# Patient Record
Sex: Male | Born: 1970
Health system: Southern US, Community
[De-identification: ages and names within clinical notes are randomized; demographics above are authoritative.]

## PROBLEM LIST (undated history)

## (undated) DIAGNOSIS — K5090 Crohn's disease, unspecified, without complications: Secondary | ICD-10-CM

## (undated) DIAGNOSIS — K56609 Unspecified intestinal obstruction, unspecified as to partial versus complete obstruction: Secondary | ICD-10-CM

## (undated) HISTORY — PX: HERNIA REPAIR: SHX51

---

## 2013-01-26 ENCOUNTER — Encounter (HOSPITAL_COMMUNITY): Payer: Self-pay | Admitting: Emergency Medicine

## 2013-01-26 ENCOUNTER — Inpatient Hospital Stay (HOSPITAL_COMMUNITY)
Admission: EM | Admit: 2013-01-26 | Discharge: 2013-01-27 | DRG: 389 | Disposition: A | Discharge status: Home / Self Care | Payer: BC Managed Care – PPO | Attending: Internal Medicine | Admitting: Internal Medicine

## 2013-01-26 DIAGNOSIS — E860 Dehydration: Secondary | ICD-10-CM

## 2013-01-26 DIAGNOSIS — K5090 Crohn's disease, unspecified, without complications: Secondary | ICD-10-CM

## 2013-01-26 DIAGNOSIS — Z881 Allergy status to other antibiotic agents status: Secondary | ICD-10-CM

## 2013-01-26 DIAGNOSIS — Z880 Allergy status to penicillin: Secondary | ICD-10-CM

## 2013-01-26 DIAGNOSIS — K56609 Unspecified intestinal obstruction, unspecified as to partial versus complete obstruction: Principal | ICD-10-CM

## 2013-01-26 DIAGNOSIS — Z79899 Other long term (current) drug therapy: Secondary | ICD-10-CM

## 2013-01-26 HISTORY — DX: Crohn's disease, unspecified, without complications: K50.90

## 2013-01-26 LAB — CBC WITH DIFFERENTIAL/PLATELET
BASOS ABS: 0 10*3/uL (ref 0.0–0.1)
Basophils Relative: 0 % (ref 0–1)
EOS ABS: 0.1 10*3/uL (ref 0.0–0.7)
EOS PCT: 1 % (ref 0–5)
HEMATOCRIT: 45.3 % (ref 39.0–52.0)
Hemoglobin: 15.7 g/dL (ref 13.0–17.0)
LYMPHS ABS: 1.4 10*3/uL (ref 0.7–4.0)
LYMPHS PCT: 18 % (ref 12–46)
MCH: 29.2 pg (ref 26.0–34.0)
MCHC: 34.7 g/dL (ref 30.0–36.0)
MCV: 84.4 fL (ref 78.0–100.0)
MONO ABS: 0.7 10*3/uL (ref 0.1–1.0)
MONOS PCT: 10 % (ref 3–12)
NEUTROS ABS: 5.2 10*3/uL (ref 1.7–7.7)
NEUTROS PCT: 70 % (ref 43–77)
Platelets: 256 10*3/uL (ref 150–400)
RBC: 5.37 MIL/uL (ref 4.22–5.81)
RDW: 13.6 % (ref 11.5–15.5)
WBC: 7.4 10*3/uL (ref 4.0–10.5)

## 2013-01-26 LAB — URINALYSIS, ROUTINE W REFLEX MICROSCOPIC
Bilirubin Urine: NEGATIVE
Glucose, UA: NEGATIVE mg/dL
HGB URINE DIPSTICK: NEGATIVE
Ketones, ur: NEGATIVE mg/dL
Leukocytes, UA: NEGATIVE
NITRITE: NEGATIVE
Protein, ur: NEGATIVE mg/dL
SPECIFIC GRAVITY, URINE: 1.004 — AB (ref 1.005–1.030)
Urobilinogen, UA: 0.2 mg/dL (ref 0.0–1.0)
pH: 7 (ref 5.0–8.0)

## 2013-01-26 LAB — COMPREHENSIVE METABOLIC PANEL
ALT: 20 U/L (ref 0–53)
AST: 26 U/L (ref 0–37)
Albumin: 4 g/dL (ref 3.5–5.2)
Alkaline Phosphatase: 60 U/L (ref 39–117)
BUN: 11 mg/dL (ref 6–23)
CALCIUM: 9.7 mg/dL (ref 8.4–10.5)
CO2: 26 mEq/L (ref 19–32)
CREATININE: 0.94 mg/dL (ref 0.50–1.35)
Chloride: 100 mEq/L (ref 96–112)
GFR calc non Af Amer: >90 mL/min (ref >90)
GLUCOSE: 103 mg/dL — AB (ref 70–99)
Potassium: 4.1 mEq/L (ref 3.7–5.3)
Sodium: 139 mEq/L (ref 137–147)
TOTAL PROTEIN: 7.2 g/dL (ref 6.0–8.3)
Total Bilirubin: 0.5 mg/dL (ref 0.3–1.2)

## 2013-01-26 LAB — LIPASE, BLOOD: Lipase: 56 U/L (ref 11–59)

## 2013-01-26 NOTE — ED Notes (Addendum)
C/o generalized abd pain, nausea, and diarrhea since 2pm today.  Denies vomiting.  History of crohn's disease.

## 2013-01-27 ENCOUNTER — Encounter (HOSPITAL_COMMUNITY): Payer: Self-pay | Admitting: Radiology

## 2013-01-27 ENCOUNTER — Emergency Department (HOSPITAL_COMMUNITY): Payer: BC Managed Care – PPO

## 2013-01-27 DIAGNOSIS — K56609 Unspecified intestinal obstruction, unspecified as to partial versus complete obstruction: Principal | ICD-10-CM

## 2013-01-27 DIAGNOSIS — K5090 Crohn's disease, unspecified, without complications: Secondary | ICD-10-CM

## 2013-01-27 DIAGNOSIS — E860 Dehydration: Secondary | ICD-10-CM

## 2013-01-27 LAB — SEDIMENTATION RATE: Sed Rate: 2 mm/hr (ref 0–16)

## 2013-01-27 LAB — C-REACTIVE PROTEIN: CRP: 0.6 mg/dL — ABNORMAL HIGH (ref <0.60)

## 2013-01-27 MED ORDER — MORPHINE SULFATE 4 MG/ML IJ SOLN
4.0000 mg | Freq: Once | INTRAMUSCULAR | Status: AC
  Administered 2013-01-27: 4 mg via INTRAVENOUS
  Filled 2013-01-27: qty 1

## 2013-01-27 MED ORDER — MORPHINE SULFATE 2 MG/ML IJ SOLN: 1.0000 mg | INTRAMUSCULAR | Status: DC | PRN

## 2013-01-27 MED ORDER — SODIUM CHLORIDE 0.9 % IV SOLN: INTRAVENOUS | Status: DC

## 2013-01-27 MED ORDER — ENOXAPARIN SODIUM 40 MG/0.4ML ~~LOC~~ SOLN
40.0000 mg | SUBCUTANEOUS | Status: DC
  Filled 2013-01-27: qty 0.4

## 2013-01-27 MED ORDER — IOHEXOL 300 MG/ML  SOLN
100.0000 mL | Freq: Once | INTRAMUSCULAR | Status: AC | PRN
  Administered 2013-01-27: 100 mL via INTRAVENOUS

## 2013-01-27 MED ORDER — ONDANSETRON HCL 4 MG/2ML IJ SOLN: 4.0000 mg | Freq: Four times a day (QID) | INTRAMUSCULAR | Status: DC | PRN

## 2013-01-27 MED ORDER — PANTOPRAZOLE SODIUM 40 MG IV SOLR
40.0000 mg | INTRAVENOUS | Status: DC
  Administered 2013-01-27: 40 mg via INTRAVENOUS
  Filled 2013-01-27: qty 40

## 2013-01-27 MED ORDER — LIDOCAINE HCL 2 % EX GEL
Freq: Once | CUTANEOUS | Status: AC
  Administered 2013-01-27: 1
  Filled 2013-01-27: qty 20

## 2013-01-27 MED ORDER — OXYMETAZOLINE HCL 0.05 % NA SOLN
2.0000 | Freq: Once | NASAL | Status: AC
  Administered 2013-01-27: 2 via NASAL
  Filled 2013-01-27: qty 15

## 2013-01-27 MED ORDER — METHYLPREDNISOLONE SODIUM SUCC 125 MG IJ SOLR
80.0000 mg | Freq: Two times a day (BID) | INTRAMUSCULAR | Status: DC
  Administered 2013-01-27: 80 mg via INTRAVENOUS
  Filled 2013-01-27: qty 1.28
  Filled 2013-01-27: qty 2
  Filled 2013-01-27: qty 1.28

## 2013-01-27 MED ORDER — SODIUM CHLORIDE 0.9 % IV BOLUS (SEPSIS)
1000.0000 mL | Freq: Once | INTRAVENOUS | Status: AC
  Administered 2013-01-27: 1000 mL via INTRAVENOUS

## 2013-01-27 MED ORDER — ONDANSETRON HCL 4 MG/2ML IJ SOLN
4.0000 mg | Freq: Once | INTRAMUSCULAR | Status: AC
  Administered 2013-01-27: 4 mg via INTRAVENOUS
  Filled 2013-01-27: qty 2

## 2013-01-27 MED ORDER — SODIUM CHLORIDE 0.9 % IV SOLN
INTRAVENOUS | Status: DC
  Administered 2013-01-27: 13:00:00 via INTRAVENOUS

## 2013-01-27 MED ORDER — ONDANSETRON HCL 4 MG PO TABS: 4.0000 mg | ORAL_TABLET | Freq: Four times a day (QID) | ORAL | Status: DC | PRN

## 2013-01-27 MED ORDER — POTASSIUM CHLORIDE IN NACL 20-0.9 MEQ/L-% IV SOLN
INTRAVENOUS | Status: DC
  Filled 2013-01-27: qty 1000

## 2013-01-27 MED ORDER — SODIUM CHLORIDE 0.9 % IJ SOLN: 3.0000 mL | Freq: Two times a day (BID) | INTRAMUSCULAR | Status: DC

## 2013-01-27 MED ORDER — IOHEXOL 300 MG/ML  SOLN: 25.0000 mL | INTRAMUSCULAR | Status: AC

## 2013-01-27 MED ORDER — PREDNISONE 10 MG PO TABS: 30.0000 mg | ORAL_TABLET | Freq: Two times a day (BID) | ORAL | Status: DC

## 2013-01-27 MED ORDER — HYDROMORPHONE HCL PF 1 MG/ML IJ SOLN
1.0000 mg | Freq: Once | INTRAMUSCULAR | Status: AC
  Administered 2013-01-27: 1 mg via INTRAVENOUS
  Filled 2013-01-27: qty 1

## 2013-01-27 MED ORDER — PREDNISONE 50 MG PO TABS
ORAL_TABLET | ORAL | Status: DC
Start: 2013-01-27 — End: 2013-01-27

## 2013-01-27 MED ORDER — BENZOCAINE 20 % MT SOLN
Freq: Once | OROMUCOSAL | Status: AC
  Administered 2013-01-27: 1 via OROMUCOSAL
  Filled 2013-01-27: qty 57

## 2013-01-27 NOTE — ED Notes (Signed)
Pt resting/ sleeping, arousable, NAD, calm, nausea & pain almost gone, tolerating PO contrast.

## 2013-01-27 NOTE — ED Notes (Signed)
Report given to  Sarah, RN.

## 2013-01-27 NOTE — Discharge Instructions (Addendum)
Small Bowel Obstruction A small bowel obstruction is a blockage (obstruction) of the small intestine (small bowel). The small bowel is a long, slender tube that connects the stomach to the colon. Its job is to absorb nutrients from the fluids and foods you consume into the bloodstream.  CAUSES  There are many causes of intestinal blockage. The most common ones include:  Hernias. This is a more common cause in children than adults.  Inflammatory bowel disease (enteritis and colitis).  Twisting of the bowel (volvulus).  Tumors.  Scar tissue (adhesions) from previous surgery or radiation treatment.  Recent surgery. This may cause an acute small bowel obstruction called an ileus. SYMPTOMS   Abdominal pain. This may be dull cramps or sharp pain. It may occur in one area or may be present in the entire abdomen. Pain can range from mild to severe, depending on the degree of obstruction.  Nausea and vomiting. Vomit may be greenish or yellow bile color.  Distended or swollen stomach. Abdominal bloating is a common symptom.  Constipation.  Lack of passing gas.  Frequent belching.  Diarrhea. This may occur if runny stool is able to leak around the obstruction. DIAGNOSIS  Your caregiver can usually diagnose small bowel obstruction by taking a history, doing a physical exam, and taking X-rays. If the cause is unclear, a CT scan (computerized tomography) of your abdomen and pelvis may be needed. TREATMENT  Treatment of the blockage depends on the cause and how bad the problem is.   Sometimes, the obstruction improves with bed rest and intravenous (IV) fluids.  Resting the bowel is very important. This means following a simple diet. Sometimes, a clear liquid diet may be required for several days.  Sometimes, a small tube (nasogastric tube) is placed into the stomach to decompress the bowel. When the bowel is blocked, it usually swells up like a balloon filled with air and fluids.  Decompression means that the air and fluids are removed by suction through that tube. This can help with pain, discomfort, and nausea. It can also help the obstruction resolve faster.  Surgery may be required if other treatments do not work. Bowel obstruction from a hernia may require early surgery and can be an emergency procedure. Adhesions that cause frequent or severe obstructions may also require surgery. HOME CARE INSTRUCTIONS If your bowel obstruction is only partial or incomplete, you may be allowed to go home.  Get plenty of rest.  Follow your diet as directed by your caregiver.  Only consume clear liquids until your condition improves.  Avoid solid foods as instructed. SEEK IMMEDIATE MEDICAL CARE IF:  You have increased pain or cramping.  You vomit blood.  You have uncontrolled vomiting or nausea.  You cannot drink fluids due to vomiting or pain.  You develop confusion.  You begin feeling very dry or thirsty (dehydrated).  You have severe bloating.  You have chills.  You have a fever.  You feel extremely weak or you faint. MAKE SURE YOU:  Understand these instructions.  Will watch your condition.  Will get help right away if you are not doing well or get worse. Document Released: 03/28/2005 Document Revised: 04/03/2011 Document Reviewed: 03/25/2010 [facility] Patient Information 2014 [place], [place].    Crohn's Disease Crohn's disease is a long-term (chronic) soreness and redness (inflammation) of the intestines (bowel). It can affect any portion of the digestive tract, from the mouth to the anus. It can also cause problems outside the digestive tract. Crohn's disease is closely  related to a disease called ulcerative colitis (together, these two diseases are called inflammatory bowel disease).  CAUSES  The cause of Crohn's disease is not known. One Nelva Bushtheory is that, in an easily affected person, the immune system is triggered to attack the body's own digestive  tissue. Crohn's disease runs in families. It seems to be more common in certain geographic areas and amongst certain races. There are no clear-cut dietary causes.  SYMPTOMS  Crohn's disease can cause many different symptoms since it can affect many different parts of the body. Symptoms include:  Fatigue.  Weight loss.  Chronic diarrhea, sometime bloody.  Abdominal pain and cramps.  Fever.  Ulcers or canker sores in the mouth or rectum.  Anemia (low red blood cells).  Arthritis, skin problems, and eye problems may occur. Complications of Crohn's disease can include:  Series of holes (perforation) of the bowel.  Portions of the intestines sticking to each other (adhesions).  Obstruction of the bowel.  Fistula formation, typically in the rectal area but also in other areas. A fistula is an opening between the bowels and the outside, or between the bowels and another organ.  A painful crack in the mucous membrane of the anus (rectal fissure). DIAGNOSIS  Your caregiver may suspect Crohn's disease based on your symptoms and an exam. Blood tests may confirm that there is a problem. You may be asked to submit a stool specimen for examination. X-rays and CT scans may be necessary. Ultimately, the diagnosis is usually made after a procedure that uses a flexible tube that is inserted via your mouth or your anus. This is done under sedation and is called either an upper endoscopy or colonoscopy. With these tests, the specialist can take tiny tissue samples and remove them from the inside of the bowel (biopsy). Examination of this biopsy tissue under a microscope can reveal Crohn's disease as the cause of your symptoms. Due to the many different forms that Crohn's disease can take, symptoms may be present for several years before a diagnosis is made. TREATMENT  Medications are often used to decrease inflammation and control the immune system. These include medicines related to aspirin, steroid  medications, and newer and stronger medications to slow down the immune system. Some medications may be used as suppositories or enemas. A number of other medications are used or have been studied. Your caregiver will make specific recommendations. HOME CARE INSTRUCTIONS   Symptoms such as diarrhea can be controlled with medications. Avoid foods that have a laxative effect such as fresh fruit, vegetables and dairy products. During flare ups, you can rest your bowel by refraining from solid foods. Drink clear liquids frequently during the day (electrolyte or re-hydrating fluids are best. Your caregiver can help you with suggestions). Drink often to prevent loss of body fluids (dehydration). When diarrhea has cleared, eat small meals and more frequently. Avoid food additives and stimulants such as caffeine (coffee, tea, or chocolate). Enzyme supplements may help if you develop intolerance to a sugar in dairy products (lactose). Ask your caregiver or dietitian about specific dietary instructions.  Try to maintain a positive attitude. Learn relaxation techniques such as self hypnosis, mental imaging, and muscle relaxation.  If possible, avoid stresses which can aggravate your condition.  Exercise regularly.  Follow your diet.  Always get plenty of rest. SEEK MEDICAL CARE IF:   Your symptoms fail to improve after a week or two of new treatment.  You experience continued weight loss.  You have ongoing cramps  or loose bowels.  You develop a new skin rash, skin sores, or eye problems. SEEK IMMEDIATE MEDICAL CARE IF:   You have worsening of your symptoms or develop new symptoms.  You have a fever.  You develop bloody diarrhea.  You develop severe abdominal pain. MAKE SURE YOU:   Understand these instructions.  Will watch your condition.  Will get help right away if you are not doing well or get worse. Document Released: 10/19/2004 Document Revised: 05/06/2012 Document Reviewed:  09/17/2006 [facility] Patient Information 2014 [place], [place].

## 2013-01-27 NOTE — H&P (Signed)
[facility] History and Physical  Patient: Shawn Dougherty  IRC:[id number]  DOB: 1970-08-29  DOS: the patient was seen and examined on 01/27/2013 PCP: Pcp Not In System  Chief Complaint: Abdominal pain with nausea and diarrhea  HPI: Shawn Dougherty is a 43 y.o. male with Past medical history of Crohn's disease with recurrent small bowel obstruction last episode 4 months ago. The patient is coming from home. The patient presented with complaints of the epigastric abdominal pain that progressively worsened starting from 2 PM today until his arrival to ED. This was associated with episode of nausea he did not have vomiting. At around 6 PM he started having episodes of diarrhea with 3-4 loose wall motions and therefore he came to the ED. Pt denies any fever, chills, headache, cough, chest pain, palpitation, shortness of breath, orthopnea, PND, active bleeding, burning urination, dizziness, pedal edema,  focal neurological deficit.   Review of Systems: as mentioned in the history of present illness.  A Comprehensive review of the other systems is negative.  Past Medical History  Diagnosis Date  . Crohn's disease    Past Surgical History  Procedure Laterality Date  . Hernia repair     Social History:  reports that he has never smoked. He does not have any smokeless tobacco history on file. He reports that he drinks alcohol. He reports that he does not use illicit drugs. Independent for most of his  ADL.  Allergies  Allergen Reactions  . Clindamycin/Lincomycin Hives  . Penicillins Other (See Comments)    Difficulty breathing, tongue swells    No family history on file.  Prior to Admission medications   Medication Sig Start Date End Date Taking? Authorizing Provider  mesalamine (PENTASA) 500 MG CR capsule Take 1,000-1,500 mg by mouth 3 (three) times daily. 3 capsules (1500 mg) every morning and every night, 2 capsules (1000 mg) at noon - after meals   Yes Historical Provider, MD   Multiple Vitamin (MULTIVITAMIN WITH MINERALS) TABS tablet Take 0.5-1 tablets by mouth daily.   Yes Historical Provider, MD    Physical Exam: Filed Vitals:   01/27/13 0515 01/27/13 0530 01/27/13 0545 01/27/13 0600  BP: 109/77 109/73 113/81 112/77  Pulse: 54 52 61 54  Temp:      TempSrc:      Resp:      SpO2: 98% 96% 100% 96%    General: Alert, Awake and Oriented to Time, Place and Person. Appear in moderate distress Eyes: PERRL ENT: Oral Mucosa clear moist. Neck: no JVD Cardiovascular: S1 and S2 Present, no Murmur, Peripheral Pulses Present Respiratory: Bilateral Air entry equal and Decreased, Clear to Auscultation,  no Crackles,no wheezes Abdomen: Bowel Sound Present sluggish, Soft and minimally diffuse tender Skin: no Rash Extremities: no Pedal edema, no calf tenderness Neurologic: Grossly Unremarkable. Labs on Admission:  CBC:  Recent Labs Lab 01/26/13 2025  WBC 7.4  NEUTROABS 5.2  HGB 15.7  HCT 45.3  MCV 84.4  PLT 256    CMP     Component Value Date/Time   NA 139 01/26/2013 2025   K 4.1 01/26/2013 2025   CL 100 01/26/2013 2025   CO2 26 01/26/2013 2025   GLUCOSE 103* 01/26/2013 2025   BUN 11 01/26/2013 2025   CREATININE 0.94 01/26/2013 2025   CALCIUM 9.7 01/26/2013 2025   PROT 7.2 01/26/2013 2025   ALBUMIN 4.0 01/26/2013 2025   AST 26 01/26/2013 2025   ALT 20 01/26/2013 2025   ALKPHOS 60 01/26/2013 2025  BILITOT 0.5 01/26/2013 2025   GFRNONAA >90 01/26/2013 2025   GFRAA >90 01/26/2013 2025     Recent Labs Lab 01/26/13 2025  LIPASE 56   No results found for this basename: AMMONIA,  in the last 168 hours  No results found for this basename: CKTOTAL, CKMB, CKMBINDEX, TROPONINI,  in the last 168 hours BNP (last 3 results) No results found for this basename: PROBNP,  in the last 8760 hours  Radiological Exams on Admission: Ct Abdomen Pelvis W Contrast  01/27/2013   CLINICAL DATA:  Generalized abdominal pain and diarrhea. History of Crohn's disease and hernia repair.  EXAM: CT  ABDOMEN AND PELVIS WITH CONTRAST  TECHNIQUE: Multidetector CT imaging of the abdomen and pelvis was performed using the standard protocol following bolus administration of intravenous contrast.  CONTRAST:  161m OMNIPAQUE IOHEXOL 300 MG/ML  SOLN  COMPARISON:  None available for comparison at time of study interpretation.  FINDINGS: Included view of the lung bases are clear. Visualized heart and pericardium are unremarkable.  Distended small bowel measures up to 4.3 cm, with small bowel feces. Transition point in the left lower quadrant (sagittal 84/155) associated with mild small bowel wall thickening. No pneumatosis. Mild free fluid in the left abdomen without abscess. No intraperitoneal free air.  The liver, spleen, gallbladder, pancreas and adrenal glands are unremarkable.  Kidneys are orthotopic, demonstrating symmetric enhancement without nephrolithiasis, hydronephrosis or renal masses. The unopacified ureters are normal in course and caliber. Delayed imaging through the kidneys demonstrates symmetric prompt excretion to the proximal urinary collecting system. Urinary bladder is partially distended and unremarkable.  Great vessels are normal in course and caliber with trace calcific atherosclerosis. No lymphadenopathy by CT size criteria. Internal reproductive organs are unremarkable. The soft tissues and included osseous structures are nonsuspicious.  IMPRESSION: High-grade small bowel obstruction, transition point in the left lower quadrant associated with mild small bowel wall thickening. There is unclear whether this could reflect stricture or possible acute inflammatory changes such as recurrent Crohn's disease.  Critical Value/emergent results were called by telephone at the time of interpretation on 01/27/2013 at 3:34 AM to Dr. DShirlyn Goltz, who verbally acknowledged these results.   Electronically Signed   By: CElon Alas  On: 01/27/2013 03:39   Assessment/Plan Principal Problem:   SBO (small  bowel obstruction) Active Problems:   Crohn disease   1. SBO (small bowel obstruction) The patient presents with complaint of nausea and abdominal pain associated with diarrhea. He has undergone a CT scan which is suggestive of high-grade small bowel obstruction with transition point in the left quadrant with thickening. His NG tube has she shown 500 cc of fluid so far. At present I would continue the NG tube, keep him n.p.o., keep him on IV Zofran, IV Protonix, IV fluids with potassium. I would also keep him on IV morphine as needed for pain. General surgery and GI has been consulted. Further recommendation will be based upon their management.  2. Crohn's disease The patient denies any diarrhea in between his episode. His last colonoscopy was a year ago. The last time he was treated for this small bowel obstruction it was without prednisone or any other medication. At present I would check ESR and CRP and follow GI recommendation.  Consults: GI and general surgery  DVT Prophylaxis: subcutaneous Heparin Nutrition: N.p.o.  Code Status: Full  Disposition: Admitted to inpatient in telemetry unit.  Author: PBerle Mull MD [facility] Pager: [phone] 71771/05/2013, 6:05 AM  If 7PM-7AM, please contact night-coverage [email or web address] Password [email or web address]

## 2013-01-27 NOTE — ED Notes (Addendum)
C/o abd pain and nausea, also reports diarrhea (denies: vomiting, constipation, bleeding or other sx), c/o increased nausea since morphine despite zofran, IVF infusing, pt aleert, NAD, calm, interactive, slowly attempting PO contrast, last ate 1800, last BM 1830 (soft/loose), last drink sweet tea in w/r upon arrival.

## 2013-01-27 NOTE — ED Notes (Signed)
Pt to CT, alert, NAD, calm, "feels better".

## 2013-01-27 NOTE — Discharge Summary (Signed)
Physician Discharge Summary  Shawn Dougherty YQM:[id number] DOB: [birth year] DOA: 01/26/2013  PCP: Pcp Not In System  Admit date: 01/26/2013 Discharge date: 01/27/2013  Recommendations for Outpatient Follow-up:  1. We have advised the patient to stay in hospital for treatment of SBO but he has refused to do so. He was adamant about going home and advancing diet on his own. I have reviewed the CT study with him again so he is aware of SBO diagnosis and need for NG tube but he reports he already know what he needs to do and is compliant with medical regimen at home. I gave him my cell phone number to call me if problems arise at home such as persistent N/V and abd pain. He know to call and come to ED for re-eval if needed.  2. Please follow up with GI and PCP per scheduled appt 3. Advance diet very slowly 4. Continue prednisone 30 mg twice day until you see GI (schedule per Dr. Randa EvensEdwards)  Discharge Diagnoses:  Active Problems:   SBO (small bowel obstruction)   Crohn disease   Dehydration    Discharge Condition: may leave today per his request; he has capacity to make his treatment decision. He does have SBO but his clinical sate is relatively good so not entirely AMA to leave today. This was communicated with GI as well.  Diet recommendation: clear liquid   History of present illness:  43 y.o. male with past medical history of Crohn's disease with recurrent small bowel obstruction last episode 4 months ago who [facility] with complaints of the epigastric abdominal pain that progressively worsened starting from 2 PM prior to this admision. This was associated with episode of nausea but he did not have vomiting. His CT abd revealed high grade SBO and NG tube was inserted to decompression and bowel rest.   Hospital Course:   Active Problems:   SBO (small bowel obstruction) - per pt request NG tube out and diet CLD - to go home with prednisone 30 mg PO BID and clear liquid  diet    Signed:  Manson PasseyEVINE, Prosper Paff, MD  [facility] 01/27/2013, 4:34 PM  Pager #: [phone]  Procedures:  NG tube   Consultations:  GI (Dr. Dorothea GlassmanEdwars)  Discharge Exam: Filed Vitals:   01/27/13 1208  BP: 99/66  Pulse: 59  Temp: 98.4 F (36.9 C)  Resp: 18   Filed Vitals:   01/27/13 0930 01/27/13 1030 01/27/13 1100 01/27/13 1208  BP: 111/79 100/78 102/72 99/66  Pulse: 53 54 58 59  Temp:    98.4 F (36.9 C)  TempSrc:    Oral  Resp:    18  Height:    5\' 7"  (1.702 m)  Weight:    74.844 kg (165 lb)  SpO2: 98% 98% 99% 98%    General: Pt is alert, follows commands appropriately, not in acute distress Cardiovascular: Regular rate and rhythm, S1/S2 +, no murmurs, no rubs, no gallops Respiratory: Clear to auscultation bilaterally, no wheezing, no crackles, no rhonchi Abdominal: Soft, non tender, non distended, bowel sounds +, no guarding Extremities: no edema, no cyanosis, pulses palpable bilaterally DP and PT Neuro: Grossly nonfocal  Discharge Instructions  Discharge Orders   Future Orders Complete By Expires   Call MD for:  difficulty breathing, headache or visual disturbances  As directed    Call MD for:  persistant dizziness or light-headedness  As directed    Call MD for:  persistant nausea and vomiting  As  directed    Call MD for:  severe uncontrolled pain  As directed    Diet - low sodium heart healthy  As directed    Discharge instructions  As directed    Comments:     1. Please follow up with GI and PCP per scheduled appt 2. Advance diet very slowly 3. Continue prednisone 30 mg twice day until you see GI (schedule per Dr. Randa Evens)   Increase activity slowly  As directed        Medication List         mesalamine 500 MG CR capsule  Commonly known as:  PENTASA  Take 1,000-1,500 mg by mouth 3 (three) times daily. 3 capsules (1500 mg) every morning and every night, 2 capsules (1000 mg) at noon - after meals     multivitamin with minerals Tabs tablet   Take 0.5-1 tablets by mouth daily.     predniSONE 10 MG tablet  Commonly known as:  DELTASONE  Take 3 tablets (30 mg total) by mouth 2 (two) times daily with a meal.           Follow-up Information   Follow up with EDWARDS JR,JAMES L, MD. Call in 1 week.   Specialty:  Gastroenterology   Contact information:   [address]                          [place] [address] [phone]        The results of significant diagnostics from this hospitalization (including imaging, microbiology, ancillary and laboratory) are listed below for reference.    Significant Diagnostic Studies: Ct Abdomen Pelvis W Contrast  01/27/2013   CLINICAL DATA:  Generalized abdominal pain and diarrhea. History of Crohn's disease and hernia repair.  EXAM: CT ABDOMEN AND PELVIS WITH CONTRAST  TECHNIQUE: Multidetector CT imaging of the abdomen and pelvis was performed using the standard protocol following bolus administration of intravenous contrast.  CONTRAST:  OMNIPAQUE IOHEXOL 300 MG/ML  SOLN  COMPARISON:  None available for comparison at time of study interpretation.  FINDINGS: Included view of the lung bases are clear. Visualized heart and pericardium are unremarkable.  Distended small bowel measures up to 4.3 cm, with small bowel feces. Transition point in the left lower quadrant (sagittal 84/155) associated with mild small bowel wall thickening. No pneumatosis. Mild free fluid in the left abdomen without abscess. No intraperitoneal free air.  The liver, spleen, gallbladder, pancreas and adrenal glands are unremarkable.  Kidneys are orthotopic, demonstrating symmetric enhancement without nephrolithiasis, hydronephrosis or renal masses. The unopacified ureters are normal in course and caliber. Delayed imaging through the kidneys demonstrates symmetric prompt excretion to the proximal urinary collecting system. Urinary bladder is partially distended and unremarkable.  Great vessels are normal in  course and caliber with trace calcific atherosclerosis. No lymphadenopathy by CT size criteria. Internal reproductive organs are unremarkable. The soft tissues and included osseous structures are nonsuspicious.  IMPRESSION: High-grade small bowel obstruction, transition point in the left lower quadrant associated with mild small bowel wall thickening. There is unclear whether this could reflect stricture or possible acute inflammatory changes such as recurrent Crohn's disease.  Critical Value/emergent results were called by telephone at the time of interpretation on 01/27/2013 at 3:34 AM to Dr. Chaney Malling , who verbally acknowledged these results.   Electronically Signed   By: Awilda Metro   On: 01/27/2013 03:39    Microbiology: No results found  for this or any previous visit (from the past 240 hour(s)).   Labs: Basic Metabolic Panel:  Recent Labs Lab 01/26/13 2025  NA 139  K 4.1  CL 100  CO2 26  GLUCOSE 103*  BUN 11  CREATININE 0.94  CALCIUM 9.7   Liver Function Tests:  Recent Labs Lab 01/26/13 2025  AST 26  ALT 20  ALKPHOS 60  BILITOT 0.5  PROT 7.2  ALBUMIN 4.0    Recent Labs Lab 01/26/13 2025  LIPASE 56   No results found for this basename: AMMONIA,  in the last 168 hours CBC:  Recent Labs Lab 01/26/13 2025  WBC 7.4  NEUTROABS 5.2  HGB 15.7  HCT 45.3  MCV 84.4  PLT 256   Cardiac Enzymes: No results found for this basename: CKTOTAL, CKMB, CKMBINDEX, TROPONINI,  in the last 168 hours BNP: BNP (last 3 results) No results found for this basename: PROBNP,  in the last 8760 hours CBG: No results found for this basename: GLUCAP,  in the last 168 hours  Time coordinating discharge: Over 30 minutes

## 2013-01-27 NOTE — Consult Note (Signed)
[facility] CONSULT Reason for consult: small bowel obstruction patient with Crohn's disease Referring Physician: ER. Primary G.I.: Dr. Dorothyann Gibbs is an 43 y.o. male.  HPI: he has a long history of Crohn's disease. He was evaluated many years ago by Dr. Present many years ago. He has been treated with Pentasa. He has had episodes of pain and SBO every few years requiring hospitalization. Biologics, antimetabolites and steroids bulb and discuss with them in the past that he has resisted therapy with these agents. Last colonoscopy2012 revealed inflammation of ileocecal valve  in terminal ileum granulomas on biopsy of the terminal ileum. The patient is been asymptomatic on Pentasa low residue diet. Yesterday he developed abdominal pain, bloating and was found to have SBO on CT scan. He's basically healthy and has no other medical problems.  Past Medical History  Diagnosis Date  . Crohn's disease     Past Surgical History  Procedure Laterality Date  . Hernia repair      No family history on file.  Social History:  reports that he has never smoked. He does not have any smokeless tobacco history on file. He reports that he drinks alcohol. He reports that he does not use illicit drugs.  Allergies:  Allergies  Allergen Reactions  . Clindamycin/Lincomycin Hives  . Penicillins Other (See Comments)    Difficulty breathing, tongue swells    Medications; Prior to Admission medications   Medication Sig Start Date End Date Taking? Authorizing Provider  mesalamine (PENTASA) 500 MG CR capsule Take 1,000-1,500 mg by mouth 3 (three) times daily. 3 capsules (1500 mg) every morning and every night, 2 capsules (1000 mg) at noon - after meals   Yes Historical Provider, MD  Multiple Vitamin (MULTIVITAMIN WITH MINERALS) TABS tablet Take 0.5-1 tablets by mouth daily.   Yes Historical Provider, MD   . methylPREDNISolone (SOLU-MEDROL) injection  80 mg Intravenous Q12H   PRN  Meds morphine injection Results for orders placed during the hospital encounter of 01/26/13 (from the past 48 hour(s))  CBC WITH DIFFERENTIAL     Status: None   Collection Time    01/26/13  8:25 PM      Result Value Range   WBC 7.4  4.0 - 10.5 K/uL   RBC 5.37  4.22 - 5.81 MIL/uL   Hemoglobin 15.7  13.0 - 17.0 g/dL   HCT 45.3  39.0 - 52.0 %   MCV 84.4  78.0 - 100.0 fL   MCH 29.2  26.0 - 34.0 pg   MCHC 34.7  30.0 - 36.0 g/dL   RDW 13.6  11.5 - 15.5 %   Platelets 256  150 - 400 K/uL   Neutrophils Relative % 70  43 - 77 %   Neutro Abs 5.2  1.7 - 7.7 K/uL   Lymphocytes Relative 18  12 - 46 %   Lymphs Abs 1.4  0.7 - 4.0 K/uL   Monocytes Relative 10  3 - 12 %   Monocytes Absolute 0.7  0.1 - 1.0 K/uL   Eosinophils Relative 1  0 - 5 %   Eosinophils Absolute 0.1  0.0 - 0.7 K/uL   Basophils Relative 0  0 - 1 %   Basophils Absolute 0.0  0.0 - 0.1 K/uL  COMPREHENSIVE METABOLIC PANEL     Status: Abnormal   Collection Time    01/26/13  8:25 PM      Result Value Range   Sodium 139  137 - 147 mEq/L   Comment:  Please note change in reference range.   Potassium 4.1  3.7 - 5.3 mEq/L   Comment: Please note change in reference range.   Chloride 100  96 - 112 mEq/L   CO2 26  19 - 32 mEq/L   Glucose, Bld 103 (*) 70 - 99 mg/dL   BUN 11  6 - 23 mg/dL   Creatinine, Ser 0.94  0.50 - 1.35 mg/dL   Calcium 9.7  8.4 - 10.5 mg/dL   Total Protein 7.2  6.0 - 8.3 g/dL   Albumin 4.0  3.5 - 5.2 g/dL   AST 26  0 - 37 U/L   ALT 20  0 - 53 U/L   Alkaline Phosphatase 60  39 - 117 U/L   Total Bilirubin 0.5  0.3 - 1.2 mg/dL   GFR calc non Af Amer >90  >90 mL/min   GFR calc Af Amer >90  >90 mL/min   Comment: (NOTE)     The eGFR has been calculated using the CKD EPI equation.     This calculation has not been validated in all clinical situations.     eGFR's persistently <90 mL/min signify possible Chronic Kidney     Disease.  LIPASE, BLOOD     Status: None   Collection Time    01/26/13  8:25 PM      Result  Value Range   Lipase 56  11 - 59 U/L  URINALYSIS, ROUTINE W REFLEX MICROSCOPIC     Status: Abnormal   Collection Time    01/26/13  8:30 PM      Result Value Range   Color, Urine YELLOW  YELLOW   APPearance CLEAR  CLEAR   Specific Gravity, Urine 1.004 (*) 1.005 - 1.030   pH 7.0  5.0 - 8.0   Glucose, UA NEGATIVE  NEGATIVE mg/dL   Hgb urine dipstick NEGATIVE  NEGATIVE   Bilirubin Urine NEGATIVE  NEGATIVE   Ketones, ur NEGATIVE  NEGATIVE mg/dL   Protein, ur NEGATIVE  NEGATIVE mg/dL   Urobilinogen, UA 0.2  0.0 - 1.0 mg/dL   Nitrite NEGATIVE  NEGATIVE   Leukocytes, UA NEGATIVE  NEGATIVE   Comment: MICROSCOPIC NOT DONE ON URINES WITH NEGATIVE PROTEIN, BLOOD, LEUKOCYTES, NITRITE, OR GLUCOSE <1000 mg/dL.    Ct Abdomen Pelvis W Contrast  01/27/2013   CLINICAL DATA:  Generalized abdominal pain and diarrhea. History of Crohn's disease and hernia repair.  EXAM: CT ABDOMEN AND PELVIS WITH CONTRAST  TECHNIQUE: Multidetector CT imaging of the abdomen and pelvis was performed using the standard protocol following bolus administration of intravenous contrast.  CONTRAST:  158mL OMNIPAQUE IOHEXOL 300 MG/ML  SOLN  COMPARISON:  None available for comparison at time of study interpretation.  FINDINGS: Included view of the lung bases are clear. Visualized heart and pericardium are unremarkable.  Distended small bowel measures up to 4.3 cm, with small bowel feces. Transition point in the left lower quadrant (sagittal 84/155) associated with mild small bowel wall thickening. No pneumatosis. Mild free fluid in the left abdomen without abscess. No intraperitoneal free air.  The liver, spleen, gallbladder, pancreas and adrenal glands are unremarkable.  Kidneys are orthotopic, demonstrating symmetric enhancement without nephrolithiasis, hydronephrosis or renal masses. The unopacified ureters are normal in course and caliber. Delayed imaging through the kidneys demonstrates symmetric prompt excretion to the proximal urinary  collecting system. Urinary bladder is partially distended and unremarkable.  Great vessels are normal in course and caliber with trace calcific atherosclerosis. No lymphadenopathy by CT size  criteria. Internal reproductive organs are unremarkable. The soft tissues and included osseous structures are nonsuspicious.  IMPRESSION: High-grade small bowel obstruction, transition point in the left lower quadrant associated with mild small bowel wall thickening. There is unclear whether this could reflect stricture or possible acute inflammatory changes such as recurrent Crohn's disease.  Critical Value/emergent results were called by telephone at the time of interpretation on 01/27/2013 at 3:34 AM to Dr. Shirlyn Goltz , who verbally acknowledged these results.   Electronically Signed   By: Elon Alas   On: 01/27/2013 03:39              Blood pressure 109/79, pulse 71, temperature 97.5 F (36.4 C), temperature source Oral, resp. rate 16, SpO2 100.00%.  Physical exam:   General-- healthy appearing white male with NG tube in place Heart-- regular rate and rhythm without murmurs are gallops Lungs--clear Abdomen-- none distended and soft without the bowel sounds with some mild tenderness in the lower abdomen   Assessment: 1. Active Crohn's disease with SBO. Patient has clinically felt fairly well on 5 ASA has continued to have these episodes. We have extensively discussed in the past biologic therapy or therapy with antimetabolites and the complications and risk of these therapies. Even his wife and 2 children are not planning to have any further children I have recommended  Considering  6MP.  Plan: Will start IV steroids now and if tolerating  Would go ahead and try CLs and hope to send home soon on CLs and prednisone to follow in office   Claretha Townshend JR,Hopelynn Gartland L 01/27/2013, 8:30 AM

## 2013-01-27 NOTE — ED Provider Notes (Signed)
CSN: [id number]     Arrival date & time 01/26/13  2012 History   First MD Initiated Contact with Patient 01/27/13 0000     Chief Complaint  Patient presents with  . Abdominal Pain   (Consider location/radiation/quality/duration/timing/severity/associated sxs/prior Treatment) The history is provided by the patient.  Quindon Denker is a 43 y.o. male hx of Crohn's with hx of SBO here with ab pain, nausea, diarrhea. Several episodes diarrhea onset today. Felt nauseous but no vomiting. He has history of stricture previously from the Crohn's and this feels like a Crohn's flare. Also has some diffuse abdominal pain as well and abdominal distention. Denies any fevers or chills.    Past Medical History  Diagnosis Date  . Crohn's disease    Past Surgical History  Procedure Laterality Date  . Hernia repair     No family history on file. History  Substance Use Topics  . Smoking status: Never Smoker   . Smokeless tobacco: Not on file  . Alcohol Use: Yes    Review of Systems  Gastrointestinal: Positive for nausea and abdominal pain.  All other systems reviewed and are negative.    Allergies  Clindamycin/lincomycin and Penicillins  Home Medications   Current Outpatient Rx  Name  Route  Sig  Dispense  Refill  . mesalamine (PENTASA) 500 MG CR capsule   Oral   Take 1,000-1,500 mg by mouth 3 (three) times daily. 3 capsules (1500 mg) every morning and every night, 2 capsules (1000 mg) at noon - after meals         . Multiple Vitamin (MULTIVITAMIN WITH MINERALS) TABS tablet   Oral   Take 0.5-1 tablets by mouth daily.          BP 121/78  Pulse 55  Temp(Src) 97.5 F (36.4 C) (Oral)  Resp 16  SpO2 96% Physical Exam  Nursing note and vitals reviewed. Constitutional: He is oriented to person, place, and time.  Uncomfortable, nauseous   HENT:  Head: Normocephalic.  Mouth/Throat: Oropharynx is clear and moist.  Eyes: Conjunctivae are normal. Pupils are equal, round, and reactive  to light.  Neck: Normal range of motion. Neck supple.  Cardiovascular: Normal rate, regular rhythm and normal heart sounds.   Pulmonary/Chest: Effort normal and breath sounds normal. No respiratory distress. He has no wheezes. He has no rales.  Abdominal: Soft. Bowel sounds are normal.  Mild diffuse tenderness, no rebound   Musculoskeletal: Normal range of motion. He exhibits no edema.  Neurological: He is alert and oriented to person, place, and time.  Skin: Skin is warm and dry.  Psychiatric: He has a normal mood and affect. His behavior is normal. Judgment and thought content normal.    ED Course  Procedures (including critical care time) Labs Review Labs Reviewed  COMPREHENSIVE METABOLIC PANEL - Abnormal; Notable for the following:    Glucose, Bld 103 (*)    All other components within normal limits  URINALYSIS, ROUTINE W REFLEX MICROSCOPIC - Abnormal; Notable for the following:    Specific Gravity, Urine 1.004 (*)    All other components within normal limits  CBC WITH DIFFERENTIAL  LIPASE, BLOOD   Imaging Review Ct Abdomen Pelvis W Contrast  01/27/2013   CLINICAL DATA:  Generalized abdominal pain and diarrhea. History of Crohn's disease and hernia repair.  EXAM: CT ABDOMEN AND PELVIS WITH CONTRAST  TECHNIQUE: Multidetector CT imaging of the abdomen and pelvis was performed using the standard protocol following bolus administration of intravenous contrast.  CONTRAST:  127m OMNIPAQUE IOHEXOL 300 MG/ML  SOLN  COMPARISON:  None available for comparison at time of study interpretation.  FINDINGS: Included view of the lung bases are clear. Visualized heart and pericardium are unremarkable.  Distended small bowel measures up to 4.3 cm, with small bowel feces. Transition point in the left lower quadrant (sagittal 84/155) associated with mild small bowel wall thickening. No pneumatosis. Mild free fluid in the left abdomen without abscess. No intraperitoneal free air.  The liver, spleen,  gallbladder, pancreas and adrenal glands are unremarkable.  Kidneys are orthotopic, demonstrating symmetric enhancement without nephrolithiasis, hydronephrosis or renal masses. The unopacified ureters are normal in course and caliber. Delayed imaging through the kidneys demonstrates symmetric prompt excretion to the proximal urinary collecting system. Urinary bladder is partially distended and unremarkable.  Great vessels are normal in course and caliber with trace calcific atherosclerosis. No lymphadenopathy by CT size criteria. Internal reproductive organs are unremarkable. The soft tissues and included osseous structures are nonsuspicious.  IMPRESSION: High-grade small bowel obstruction, transition point in the left lower quadrant associated with mild small bowel wall thickening. There is unclear whether this could reflect stricture or possible acute inflammatory changes such as recurrent Crohn's disease.  Critical Value/emergent results were called by telephone at the time of interpretation on 01/27/2013 at 3:34 AM to Dr. DShirlyn Goltz, who verbally acknowledged these results.   Electronically Signed   By: CElon Alas  On: 01/27/2013 03:39    EKG Interpretation   None       MDM  No diagnosis found. BKimberley Dastrupis a 43y.o. male hx of Crohn's here with ab pain, distention. Consider Crohn's flare vs SBO. Will hydrate, give zofran, get CT ab/pel.   4:22 AM CT showed high grade SBO. I called Dr. BBarry Dienes who states that its likely to be Crohn's flare [facility] who will [facility] patient in AM. I ordered NG tube. Will admit to med/surg under Dr. PPosey Prontofor SBO, Crohn's flare.    DWandra Arthurs MD 01/27/13 [phone]

## 2013-01-27 NOTE — ED Notes (Signed)
Initial return  375cc green yellow bile

## 2013-01-27 NOTE — ED Notes (Signed)
Admitting MD at BS.  

## 2013-01-27 NOTE — Progress Notes (Signed)
Reviewed discharge instructions with patient and he stated his understanding.  NG tube removed without complications.  Patient tolerating clear liquids, with +bowel sounds and passing gas.  OK to discharge per Dr. Elisabeth Pigeonevine.  Colman Caterarpley, Netanel Yannuzzi Danielle

## 2013-01-27 NOTE — Progress Notes (Signed)
Utilization review completed.  

## 2013-01-27 NOTE — ED Notes (Signed)
Pt sleeping, NGT to low intermittent suction, output volume continues to increase slowly, calm, NAD, VSS.

## 2013-10-25 ENCOUNTER — Emergency Department (HOSPITAL_COMMUNITY)
Admission: EM | Admit: 2013-10-25 | Discharge: 2013-10-25 | Disposition: A | Discharge status: Home / Self Care | Payer: BC Managed Care – PPO | Attending: Emergency Medicine | Admitting: Emergency Medicine

## 2013-10-25 ENCOUNTER — Encounter (HOSPITAL_COMMUNITY): Payer: Self-pay | Admitting: Emergency Medicine

## 2013-10-25 DIAGNOSIS — Y92838 Other recreation area as the place of occurrence of the external cause: Secondary | ICD-10-CM | POA: Diagnosis not present

## 2013-10-25 DIAGNOSIS — S39011A Strain of muscle, fascia and tendon of abdomen, initial encounter: Secondary | ICD-10-CM | POA: Diagnosis not present

## 2013-10-25 DIAGNOSIS — Z880 Allergy status to penicillin: Secondary | ICD-10-CM | POA: Diagnosis not present

## 2013-10-25 DIAGNOSIS — K5090 Crohn's disease, unspecified, without complications: Secondary | ICD-10-CM | POA: Diagnosis not present

## 2013-10-25 DIAGNOSIS — Z79899 Other long term (current) drug therapy: Secondary | ICD-10-CM | POA: Insufficient documentation

## 2013-10-25 DIAGNOSIS — Z9889 Other specified postprocedural states: Secondary | ICD-10-CM | POA: Insufficient documentation

## 2013-10-25 DIAGNOSIS — X58XXXA Exposure to other specified factors, initial encounter: Secondary | ICD-10-CM | POA: Insufficient documentation

## 2013-10-25 DIAGNOSIS — Y93B3 Activity, free weights: Secondary | ICD-10-CM | POA: Diagnosis not present

## 2013-10-25 DIAGNOSIS — R1012 Left upper quadrant pain: Secondary | ICD-10-CM | POA: Diagnosis present

## 2013-10-25 HISTORY — DX: Crohn's disease, unspecified, without complications: K50.90

## 2013-10-25 MED ORDER — OXYCODONE-ACETAMINOPHEN 5-325 MG PO TABS: 1.0000 | ORAL_TABLET | ORAL | Status: DC | PRN

## 2013-10-25 MED ORDER — OXYCODONE-ACETAMINOPHEN 5-325 MG PO TABS
1.0000 | ORAL_TABLET | Freq: Once | ORAL | Status: AC
  Administered 2013-10-25: 1 via ORAL
  Filled 2013-10-25: qty 1

## 2013-10-25 NOTE — ED Provider Notes (Signed)
CSN: [id number]     Arrival date & time 10/25/13  1224 History   First MD Initiated Contact with Patient 10/25/13 1234     Chief Complaint  Patient presents with  . Flank Pain     (Consider location/radiation/quality/duration/timing/severity/associated sxs/prior Treatment) HPI 43 year old male presents with 5 days of left flank pain. It started while he is at the gym doing leg lifts. He was pushing up against resistance in either while pushing out or when he came back towards his abdomen he felt a sharp pain in his left upper quadrant and left flank. He's been having approximately a 4/10 aching pain in his side for the past 4 days. Today he went back to the gym and was doing stretching and bicycling and after that noticed a sharper pain. He was trying to drive home and noticed that he was feeling nauseous because the pain got worse and felt a lightheaded and sweaty. This seemed to resolve as the pain got better. There are no pleuritic symptoms, shortness of breath, or chest pain. The pain hurts with any type of movement but if he lies still his pain is nearly gone. He took Aleve this morning with some relief. Denies bruising, hematuria, cough, or radiation of the pain.  Past Medical History  Diagnosis Date  . Crohn's disease   . Crohn disease    Past Surgical History  Procedure Laterality Date  . Hernia repair     No family history on file. History  Substance Use Topics  . Smoking status: Never Smoker   . Smokeless tobacco: Not on file  . Alcohol Use: Yes    Review of Systems  Constitutional: Negative for fever.  Respiratory: Negative for cough and shortness of breath.   Cardiovascular: Negative for chest pain.  Gastrointestinal: Negative for vomiting.  Genitourinary: Positive for flank pain. Negative for dysuria and hematuria.  All other systems reviewed and are negative.     Allergies  Clindamycin/lincomycin and Penicillins  Home Medications   Prior to Admission  medications   Medication Sig Start Date End Date Taking? Authorizing Provider  mesalamine (PENTASA) 500 MG CR capsule Take 1,000-1,500 mg by mouth 3 (three) times daily. 3 capsules (1500 mg) every morning and every night, 2 capsules (1000 mg) at noon - after meals   Yes Historical Provider, MD  Multiple Vitamin (MULTIVITAMIN WITH MINERALS) TABS tablet Take 0.5-1 tablets by mouth daily.   Yes Historical Provider, MD  oxyCODONE-acetaminophen (PERCOCET) 5-325 MG per tablet Take 1 tablet by mouth every 4 (four) hours as needed for severe pain. 10/25/13   Audree Camel, MD   BP 111/63  Pulse 66  Temp(Src) 97.9 F (36.6 C) (Oral)  Resp 15  SpO2 100% Physical Exam  Nursing note and vitals reviewed. Constitutional: He is oriented to person, place, and time. He appears well-developed and well-nourished.  HENT:  Head: Normocephalic and atraumatic.  Right Ear: External ear normal.  Left Ear: External ear normal.  Nose: Nose normal.  Eyes: Right eye exhibits no discharge. Left eye exhibits no discharge.  Neck: Neck supple.  Cardiovascular: Normal rate, regular rhythm, normal heart sounds and intact distal pulses.   Pulmonary/Chest: Effort normal and breath sounds normal. He exhibits no tenderness.  Abdominal: Soft. There is tenderness.    Musculoskeletal: He exhibits no edema.  Neurological: He is alert and oriented to person, place, and time.  Skin: Skin is warm and dry.    ED Course  Procedures (including critical care time) Labs Review  Labs Reviewed - No data to display  Imaging Review No results found.   EKG Interpretation None      MDM   Final diagnoses:  Abdominal wall strain, initial encounter    Patient's history and exam is most consistent with an abdominal wall strain. Patient is very concerned because pain is continuing, but given the location of his pain and the way he injured it I would expect his pain to last for one to 2 weeks. I have very low suspicion for  more serious muscle injury or intra-abdominal injury given that there was no trauma to his abdomen or flanks. There is no hematuria or ecchymosis. I offered IV pain control given the patient is having pain but he declines and wants oral pain control. I discussed stretching exercises, RICE, and followup as needed.    Audree CamelScott T Braian Tijerina, MD 10/25/13 [phone]

## 2013-10-25 NOTE — Discharge Instructions (Signed)

## 2013-10-25 NOTE — ED Notes (Addendum)
Pt presents with left flank pain; was doing leg press exercising and felt painful pop on Monday. Worsening pain and diaphoresis and dizziness. Visibly uncomfortable. Very tender to palpation. Pt worried "about spleen". Worked out this morning.

## 2013-10-25 NOTE — ED Notes (Signed)
Dr. Goldston at bedside.  

## 2013-10-25 NOTE — ED Notes (Signed)
Pt is anxious

## 2013-10-27 ENCOUNTER — Telehealth (HOSPITAL_COMMUNITY): Payer: Self-pay

## 2014-01-24 ENCOUNTER — Emergency Department (HOSPITAL_COMMUNITY): Payer: BC Managed Care – PPO

## 2014-01-24 ENCOUNTER — Inpatient Hospital Stay (HOSPITAL_COMMUNITY)
Admission: EM | Admit: 2014-01-24 | Discharge: 2014-01-25 | DRG: 386 | Disposition: A | Discharge status: Home / Self Care | Payer: BC Managed Care – PPO | Attending: Internal Medicine | Admitting: Internal Medicine

## 2014-01-24 ENCOUNTER — Encounter (HOSPITAL_COMMUNITY): Payer: Self-pay | Admitting: Emergency Medicine

## 2014-01-24 DIAGNOSIS — K50918 Crohn's disease, unspecified, with other complication: Secondary | ICD-10-CM

## 2014-01-24 DIAGNOSIS — K5090 Crohn's disease, unspecified, without complications: Secondary | ICD-10-CM | POA: Diagnosis present

## 2014-01-24 DIAGNOSIS — E860 Dehydration: Secondary | ICD-10-CM | POA: Diagnosis present

## 2014-01-24 DIAGNOSIS — K56609 Unspecified intestinal obstruction, unspecified as to partial versus complete obstruction: Secondary | ICD-10-CM

## 2014-01-24 DIAGNOSIS — R1084 Generalized abdominal pain: Secondary | ICD-10-CM | POA: Diagnosis present

## 2014-01-24 DIAGNOSIS — K5000 Crohn's disease of small intestine without complications: Secondary | ICD-10-CM

## 2014-01-24 DIAGNOSIS — K50012 Crohn's disease of small intestine with intestinal obstruction: Principal | ICD-10-CM | POA: Diagnosis present

## 2014-01-24 DIAGNOSIS — R109 Unspecified abdominal pain: Secondary | ICD-10-CM

## 2014-01-24 HISTORY — DX: Unspecified intestinal obstruction, unspecified as to partial versus complete obstruction: K56.609

## 2014-01-24 LAB — URINALYSIS, ROUTINE W REFLEX MICROSCOPIC
Bilirubin Urine: NEGATIVE
Glucose, UA: NEGATIVE mg/dL
Hgb urine dipstick: NEGATIVE
Ketones, ur: NEGATIVE mg/dL
Leukocytes, UA: NEGATIVE
Nitrite: NEGATIVE
Protein, ur: NEGATIVE mg/dL
Specific Gravity, Urine: 1.015 (ref 1.005–1.030)
Urobilinogen, UA: 0.2 mg/dL (ref 0.0–1.0)
pH: 6 (ref 5.0–8.0)

## 2014-01-24 LAB — COMPREHENSIVE METABOLIC PANEL
ALK PHOS: 57 U/L (ref 39–117)
ALT: 23 U/L (ref 0–53)
ANION GAP: 7 (ref 5–15)
AST: 31 U/L (ref 0–37)
Albumin: 4.3 g/dL (ref 3.5–5.2)
BUN: 13 mg/dL (ref 6–23)
CO2: 29 mmol/L (ref 19–32)
Calcium: 9.5 mg/dL (ref 8.4–10.5)
Chloride: 99 mEq/L (ref 96–112)
Creatinine, Ser: 1.14 mg/dL (ref 0.50–1.35)
GFR calc non Af Amer: 78 mL/min — ABNORMAL LOW (ref >90)
GLUCOSE: 102 mg/dL — AB (ref 70–99)
POTASSIUM: 3.8 mmol/L (ref 3.5–5.1)
SODIUM: 135 mmol/L (ref 135–145)
Total Bilirubin: 1 mg/dL (ref 0.3–1.2)
Total Protein: 6.9 g/dL (ref 6.0–8.3)

## 2014-01-24 LAB — CBC WITH DIFFERENTIAL/PLATELET
Basophils Absolute: 0 10*3/uL (ref 0.0–0.1)
Basophils Relative: 0 % (ref 0–1)
EOS ABS: 0.2 10*3/uL (ref 0.0–0.7)
Eosinophils Relative: 2 % (ref 0–5)
HCT: 47.9 % (ref 39.0–52.0)
HEMOGLOBIN: 15.8 g/dL (ref 13.0–17.0)
Lymphocytes Relative: 21 % (ref 12–46)
Lymphs Abs: 2 10*3/uL (ref 0.7–4.0)
MCH: 28.5 pg (ref 26.0–34.0)
MCHC: 33 g/dL (ref 30.0–36.0)
MCV: 86.3 fL (ref 78.0–100.0)
Monocytes Absolute: 0.9 10*3/uL (ref 0.1–1.0)
Monocytes Relative: 10 % (ref 3–12)
NEUTROS PCT: 67 % (ref 43–77)
Neutro Abs: 6.1 10*3/uL (ref 1.7–7.7)
Platelets: 274 10*3/uL (ref 150–400)
RBC: 5.55 MIL/uL (ref 4.22–5.81)
RDW: 13.6 % (ref 11.5–15.5)
WBC: 9.1 10*3/uL (ref 4.0–10.5)

## 2014-01-24 LAB — LIPASE, BLOOD: Lipase: 41 U/L (ref 11–59)

## 2014-01-24 LAB — SEDIMENTATION RATE: SED RATE: 3 mm/h (ref 0–16)

## 2014-01-24 MED ORDER — IOHEXOL 300 MG/ML  SOLN
25.0000 mL | Freq: Once | INTRAMUSCULAR | Status: AC | PRN
  Administered 2014-01-24: 25 mL via ORAL

## 2014-01-24 MED ORDER — ONDANSETRON HCL 4 MG/2ML IJ SOLN: 4.0000 mg | INTRAMUSCULAR | Status: DC | PRN

## 2014-01-24 MED ORDER — LIDOCAINE VISCOUS 2 % MT SOLN
15.0000 mL | Freq: Once | OROMUCOSAL | Status: AC
  Administered 2014-01-24: 15 mL via OROMUCOSAL
  Filled 2014-01-24: qty 15

## 2014-01-24 MED ORDER — HYDROMORPHONE HCL 1 MG/ML IJ SOLN
1.0000 mg | Freq: Once | INTRAMUSCULAR | Status: AC
  Administered 2014-01-24: 1 mg via INTRAVENOUS
  Filled 2014-01-24: qty 1

## 2014-01-24 MED ORDER — OXYMETAZOLINE HCL 0.05 % NA SOLN
1.0000 | Freq: Once | NASAL | Status: AC
  Administered 2014-01-24: 1 via NASAL
  Filled 2014-01-24: qty 15

## 2014-01-24 MED ORDER — PANTOPRAZOLE SODIUM 40 MG IV SOLR
40.0000 mg | INTRAVENOUS | Status: DC
  Administered 2014-01-24: 40 mg via INTRAVENOUS
  Filled 2014-01-24 (×3): qty 40

## 2014-01-24 MED ORDER — HYDROMORPHONE HCL 1 MG/ML IJ SOLN
1.0000 mg | Freq: Once | INTRAMUSCULAR | Status: DC
Start: 2014-01-24 — End: 2014-01-24
  Administered 2014-01-24: 1 mg via INTRAVENOUS
  Filled 2014-01-24: qty 1

## 2014-01-24 MED ORDER — ONDANSETRON HCL 4 MG/2ML IJ SOLN
4.0000 mg | Freq: Once | INTRAMUSCULAR | Status: DC
  Administered 2014-01-24: 4 mg via INTRAVENOUS
  Filled 2014-01-24: qty 2

## 2014-01-24 MED ORDER — METHYLPREDNISOLONE SODIUM SUCC 125 MG IJ SOLR
60.0000 mg | Freq: Two times a day (BID) | INTRAMUSCULAR | Status: DC
  Administered 2014-01-24 – 2014-01-25 (×2): 60 mg via INTRAVENOUS
  Filled 2014-01-24: qty 2
  Filled 2014-01-24: qty 0.96
  Filled 2014-01-24: qty 2
  Filled 2014-01-24: qty 0.96

## 2014-01-24 MED ORDER — PHENOL 1.4 % MT LIQD
1.0000 | OROMUCOSAL | Status: DC | PRN
  Administered 2014-01-24: 1 via OROMUCOSAL
  Filled 2014-01-24: qty 177

## 2014-01-24 MED ORDER — IOHEXOL 300 MG/ML  SOLN
100.0000 mL | Freq: Once | INTRAMUSCULAR | Status: AC | PRN
  Administered 2014-01-24: 100 mL via INTRAVENOUS

## 2014-01-24 MED ORDER — ONDANSETRON HCL 4 MG/2ML IJ SOLN
4.0000 mg | Freq: Once | INTRAMUSCULAR | Status: AC
  Administered 2014-01-24: 4 mg via INTRAVENOUS
  Filled 2014-01-24: qty 2

## 2014-01-24 MED ORDER — METHYLPREDNISOLONE SODIUM SUCC 125 MG IJ SOLR
125.0000 mg | Freq: Once | INTRAMUSCULAR | Status: AC
  Administered 2014-01-24: 125 mg via INTRAVENOUS
  Filled 2014-01-24: qty 2

## 2014-01-24 MED ORDER — DIPHENHYDRAMINE HCL 50 MG/ML IJ SOLN
12.5000 mg | Freq: Three times a day (TID) | INTRAMUSCULAR | Status: DC | PRN
  Administered 2014-01-24: 12.5 mg via INTRAVENOUS
  Filled 2014-01-24: qty 1

## 2014-01-24 MED ORDER — HYDROMORPHONE HCL 1 MG/ML IJ SOLN: 1.0000 mg | INTRAMUSCULAR | Status: DC | PRN

## 2014-01-24 MED ORDER — ENOXAPARIN SODIUM 40 MG/0.4ML ~~LOC~~ SOLN
40.0000 mg | SUBCUTANEOUS | Status: DC
  Administered 2014-01-24: 40 mg via SUBCUTANEOUS
  Filled 2014-01-24 (×3): qty 0.4

## 2014-01-24 MED ORDER — POTASSIUM CHLORIDE IN NACL 20-0.9 MEQ/L-% IV SOLN
INTRAVENOUS | Status: DC
  Administered 2014-01-24 – 2014-01-25 (×2): via INTRAVENOUS
  Filled 2014-01-24 (×3): qty 1000

## 2014-01-24 NOTE — ED Notes (Signed)
Pt. reports genetalized abdominal pain onset this evening , denies nausea or vomitting , no fever or diarrhea .

## 2014-01-24 NOTE — Consult Note (Signed)
[facility] Consult Note  Referring Provider: No ref. provider found Primary Care Physician:  Geoffery Lyons, MD Primary Gastroenterologist:  Dr.  Laurel Dimmer Complaint: Abdominal pain and nausea HPI: Shawn Dougherty is an 44 y.o. white male  with a ten-year history of Crohn's disease, followed by Dr. Oletta Lamas. Apparently this is been primarily manifest by intermittent small bowel obstructions which have been successfully treated medically. He is on Pentasa long-term and has discussed second line agents with Dr. Oletta Lamas but has never been started on a biologic goal agent or 6-MP. He presented with generalized abdominal pain and nausea that he says is typical for his obstructive episodes. Is not actually had any vomiting. CT scan showed fluid-filled dilated small bowel with transition point in the mid jejunum consistent with small bowel obstruction.  Past Medical History  Diagnosis Date  . Crohn's disease   . Crohn disease   . SBO (small bowel obstruction)     Past Surgical History  Procedure Laterality Date  . Hernia repair      Medications Prior to Admission  Medication Sig Dispense Refill  . mesalamine (PENTASA) 500 MG CR capsule Take 1,000-1,500 mg by mouth 3 (three) times daily. 3 capsules (1500 mg) every morning and every night, 2 capsules (1000 mg) at noon - after meals    . Multiple Vitamin (MULTIVITAMIN WITH MINERALS) TABS tablet Take 0.5-1 tablets by mouth daily.    Marland Kitchen oxyCODONE-acetaminophen (PERCOCET) 5-325 MG per tablet Take 1 tablet by mouth every 4 (four) hours as needed for severe pain. (Patient not taking: Reported on 01/24/2014) 20 tablet 0    Allergies:  Allergies  Allergen Reactions  . Clindamycin/Lincomycin Hives  . Penicillins Other (See Comments)    Difficulty breathing, tongue swells    No family history on file.  Social History:  reports that he has never smoked. He does not have any smokeless tobacco history on file. He reports that he drinks  alcohol. He reports that he does not use illicit drugs.  Review of Systems: negative except as above  Blood pressure 117/79, pulse 65, temperature 98.1 F (36.7 C), temperature source Oral, resp. rate 20, SpO2 96 %. Head: Normocephalic, without obvious abnormality, atraumatic Neck: no adenopathy, no carotid bruit, no JVD, supple, symmetrical, trachea midline and thyroid not enlarged, symmetric, no tenderness/mass/nodules Resp: clear to auscultation bilaterally Cardio: regular rate and rhythm, S1, S2 normal, no murmur, click, rub or gallop GI: Abdomen soft nondistended with hypoactive bowel sounds minimally tender without focality  Extremities: extremities normal, atraumatic, no cyanosis or edema  Results for orders placed or performed during the hospital encounter of 01/24/14 (from the past 48 hour(s))  CBC with Differential     Status: None   Collection Time: 01/24/14  2:56 AM  Result Value Ref Range   WBC 9.1 4.0 - 10.5 K/uL   RBC 5.55 4.22 - 5.81 MIL/uL   Hemoglobin 15.8 13.0 - 17.0 g/dL   HCT 47.9 39.0 - 52.0 %   MCV 86.3 78.0 - 100.0 fL   MCH 28.5 26.0 - 34.0 pg   MCHC 33.0 30.0 - 36.0 g/dL   RDW 13.6 11.5 - 15.5 %   Platelets 274 150 - 400 K/uL   Neutrophils Relative % 67 43 - 77 %   Neutro Abs 6.1 1.7 - 7.7 K/uL   Lymphocytes Relative 21 12 - 46 %   Lymphs Abs 2.0 0.7 - 4.0 K/uL   Monocytes Relative 10 3 - 12 %   Monocytes Absolute 0.9 0.1 -  1.0 K/uL   Eosinophils Relative 2 0 - 5 %   Eosinophils Absolute 0.2 0.0 - 0.7 K/uL   Basophils Relative 0 0 - 1 %   Basophils Absolute 0.0 0.0 - 0.1 K/uL    Comment: Performed at [facility]  Comprehensive metabolic panel     Status: Abnormal   Collection Time: 01/24/14  2:56 AM  Result Value Ref Range   Sodium 135 135 - 145 mmol/L    Comment: Please note change in reference range.   Potassium 3.8 3.5 - 5.1 mmol/L    Comment: Please note change in reference range.   Chloride 99 96 - 112 mEq/L   CO2 29 19 -  32 mmol/L   Glucose, Bld 102 (H) 70 - 99 mg/dL   BUN 13 6 - 23 mg/dL   Creatinine, Ser 1.14 0.50 - 1.35 mg/dL   Calcium 9.5 8.4 - 10.5 mg/dL   Total Protein 6.9 6.0 - 8.3 g/dL   Albumin 4.3 3.5 - 5.2 g/dL   AST 31 0 - 37 U/L   ALT 23 0 - 53 U/L   Alkaline Phosphatase 57 39 - 117 U/L   Total Bilirubin 1.0 0.3 - 1.2 mg/dL   GFR calc non Af Amer 78 (L) >90 mL/min   GFR calc Af Amer >90 >90 mL/min    Comment: (NOTE) The eGFR has been calculated using the CKD EPI equation. This calculation has not been validated in all clinical situations. eGFR's persistently <90 mL/min signify possible Chronic Kidney Disease.    Anion gap 7 5 - 15  Lipase, blood     Status: None   Collection Time: 01/24/14  2:56 AM  Result Value Ref Range   Lipase 41 11 - 59 U/L  Urinalysis, Routine w reflex microscopic     Status: None   Collection Time: 01/24/14  4:51 AM  Result Value Ref Range   Color, Urine YELLOW YELLOW   APPearance CLEAR CLEAR   Specific Gravity, Urine 1.015 1.005 - 1.030   pH 6.0 5.0 - 8.0   Glucose, UA NEGATIVE NEGATIVE mg/dL   Hgb urine dipstick NEGATIVE NEGATIVE   Bilirubin Urine NEGATIVE NEGATIVE   Ketones, ur NEGATIVE NEGATIVE mg/dL   Protein, ur NEGATIVE NEGATIVE mg/dL   Urobilinogen, UA 0.2 0.0 - 1.0 mg/dL   Nitrite NEGATIVE NEGATIVE   Leukocytes, UA NEGATIVE NEGATIVE    Comment: MICROSCOPIC NOT DONE ON URINES WITH NEGATIVE PROTEIN, BLOOD, LEUKOCYTES, NITRITE, OR GLUCOSE <1000 mg/dL.   Ct Abdomen Pelvis W Contrast  01/24/2014   CLINICAL DATA:  All over abdominal pain onset 6 p.m. last evening. Nausea and vomiting. History of Crohn's disease.  EXAM: CT ABDOMEN AND PELVIS WITH CONTRAST  TECHNIQUE: Multidetector CT imaging of the abdomen and pelvis was performed using the standard protocol following bolus administration of intravenous contrast.  CONTRAST:  173m OMNIPAQUE IOHEXOL 300 MG/ML  SOLN  COMPARISON:  01/27/2013  FINDINGS: Mild dependent changes in the lung bases.  The liver,  spleen, gallbladder, pancreas, adrenal glands, kidneys, normal item abdominal aorta, inferior vena cava, and retroperitoneal lymph nodes are unremarkable. Stomach appears normal. There is dilated fluid-filled small bowel with transition zone in the left lower quadrant consistent with small bowel obstruction. B ileum at the transition zone appears to demonstrate wall thickening and will bump with mural enhancement consistent with inflammatory stricture from Crohn's disease. There is also involvement of the terminal ileum. The colon is stool filled but decompressed. No free air or free  fluid in the abdomen.  Pelvis: Small amount of free fluid in the pelvis. This is likely reactive. Mild wall thickening in the bladder may be reactive or due to urinary tract infection. No pelvic mass or lymphadenopathy. Prostate gland is not enlarged. No normal alignment of the lumbar spine. No destructive bone lesions. Move  IMPRESSION: Small bowel obstruction with transition zone in the left lower quadrant mid ileum. Ileum demonstrates wall thickening and enhancement consistent with inflammatory changes due to Crohn's disease. Inflammatory changes also suggested in the sigmoid colon. Small amount of free fluid in the pelvis is likely reactive.   Electronically Signed   By: Lucienne Capers M.D.   On: 01/24/2014 06:07    Assessment: Small bowel obstruction secondary to terminal ileal Crohn's disease  Plan:  Agree with IV Solu-Medrol and NG suctioning. The patient thinks his obstruction has aren't resolved and wants his NG tube out. I told him I did not feel comfortable removing it this early. If NG output low and KUB shows significant improvement in the morning could DC NG and transition to oral prednisone as tolerated. If obstruction does not resolve, surgical consult.  HKFEX,MDYJ C 01/24/2014, 9:40 AM

## 2014-01-24 NOTE — ED Notes (Signed)
Floor RN unable to take report at this time.

## 2014-01-24 NOTE — ED Notes (Signed)
CT called and informed pt has finished her contrast.  

## 2014-01-24 NOTE — H&P (Signed)
Shawn Dougherty is an 44 y.o. male.   PCP:   Geoffery Lyons, MD   Chief Complaint:  SBO  HPI: 44 y.o. male with a history of Crohn's Disease and SBO who presents to the Emergency Department c constant, generalized abdominal pain and nausea that started last night. He has a history of multiple similar flare-ups associated with Crohn's Disease and states current symptoms are consistent with prior bowel obstructions "where his small intestine meets the large intestine." Pt states that flare-ups normally resolve on their own or are decompressed. He denies a history of abdominal surgeries. In the course of 15 years, pt has had 8-10 small bowel obstructions. His last SBO was Jan 2015 and he had NGT pulled early and he went home prior to completing inpatient treatments - however he did well. His last BM was last night. He takes Pentasa 3200 mg daily. Pt denies any other problems associated with Crohn's disease. No vomiting. Biologics, antimetabolites and steroids have been discussed with him in the past and he has resisted these therapiess. Last colonoscopy 2012 revealed inflammation of ileocecal valve in terminal ileum granulomas on biopsy of the terminal ileum. He's basically healthy and has no other medical problems.  In ED labs were fine.  CT revealed: Small bowel obstruction with transition zone in the left lower quadrant mid ileum. Ileum demonstrates wall thickening and enhancement consistent with inflammatory changes due to Crohn's disease. Inflammatory changes also suggested in the sigmoid colon. Small amount of free fluid in the pelvis is likely reactive.  I was called for inpt admission.  EDP also called Dr Oletta Lamas group for consult.  IVF and steroids given    Past Medical History:  Past Medical History  Diagnosis Date  . Crohn's disease   . Crohn disease   . SBO (small bowel obstruction)     Past Surgical History  Procedure Laterality Date  . Hernia repair        Allergies:    Allergies  Allergen Reactions  . Clindamycin/Lincomycin Hives  . Penicillins Other (See Comments)    Difficulty breathing, tongue swells     Medications: Prior to Admission medications   Medication Sig Start Date End Date Taking? Authorizing Provider  mesalamine (PENTASA) 500 MG CR capsule Take 1,000-1,500 mg by mouth 3 (three) times daily. 3 capsules (1500 mg) every morning and every night, 2 capsules (1000 mg) at noon - after meals   Yes Historical Provider, MD  Multiple Vitamin (MULTIVITAMIN WITH MINERALS) TABS tablet Take 0.5-1 tablets by mouth daily.   Yes Historical Provider, MD  oxyCODONE-acetaminophen (PERCOCET) 5-325 MG per tablet Take 1 tablet by mouth every 4 (four) hours as needed for severe pain. Patient not taking: Reported on 01/24/2014 10/25/13   Ephraim Hamburger, MD      (Not in a hospital admission)   Social History:  reports that he has never smoked. He does not have any smokeless tobacco history on file. He reports that he drinks alcohol. He reports that he does not use illicit drugs.  Family History: No family history on file.  Review of Systems:  Review of Systems - Full ROS obtained See HPI. No other Sxs.   Physical Exam:  Blood pressure 111/75, pulse 60, temperature 98.1 F (36.7 C), temperature source Oral, resp. rate 16, SpO2 94 %. Filed Vitals:   01/24/14 0330 01/24/14 0345 01/24/14 0500 01/24/14 0515  BP: 126/87 127/67 118/79 111/75  Pulse: 64 61 75 60  Temp:      TempSrc:  Resp:      SpO2: 98% 97% 94% 94%   General appearance: A and O Head: Normocephalic, without obvious abnormality, atraumatic Eyes: conjunctivae/corneas clear. PERRL, EOM's intact.  Nose: Nares normal. Septum midline. Mucosa normal. No drainage or sinus tenderness. OP - dry Neck: no adenopathy, no carotid bruit, no JVD and thyroid not enlarged, symmetric, no tenderness/mass/nodules Resp: CTA Cardio: Reg GI: Distended, Mild - Mod tender.  (-) reb. + Guarding.  Does  not appear toxic Extremities: extremities normal, atraumatic, no cyanosis or edema Pulses: 2+ and symmetric Lymph nodes:  no cervical lymphadenopathy Neurologic: Alert and oriented X 3, normal strength and tone. Normal symmetric reflexes.     Labs on Admission:   Recent Labs  01/24/14 0256  NA 135  K 3.8  CL 99  CO2 29  GLUCOSE 102*  BUN 13  CREATININE 1.14  CALCIUM 9.5    Recent Labs  01/24/14 0256  AST 31  ALT 23  ALKPHOS 57  BILITOT 1.0  PROT 6.9  ALBUMIN 4.3    Recent Labs  01/24/14 0256  LIPASE 41    Recent Labs  01/24/14 0256  WBC 9.1  NEUTROABS 6.1  HGB 15.8  HCT 47.9  MCV 86.3  PLT 274   No results for input(s): CKTOTAL, CKMB, CKMBINDEX, TROPONINI in the last 72 hours. No results found for: INR, PROTIME   LAB RESULT POCT:  Results for orders placed or performed during the hospital encounter of 01/24/14  CBC with Differential  Result Value Ref Range   WBC 9.1 4.0 - 10.5 K/uL   RBC 5.55 4.22 - 5.81 MIL/uL   Hemoglobin 15.8 13.0 - 17.0 g/dL   HCT 47.9 39.0 - 52.0 %   MCV 86.3 78.0 - 100.0 fL   MCH 28.5 26.0 - 34.0 pg   MCHC 33.0 30.0 - 36.0 g/dL   RDW 13.6 11.5 - 15.5 %   Platelets 274 150 - 400 K/uL   Neutrophils Relative % 67 43 - 77 %   Neutro Abs 6.1 1.7 - 7.7 K/uL   Lymphocytes Relative 21 12 - 46 %   Lymphs Abs 2.0 0.7 - 4.0 K/uL   Monocytes Relative 10 3 - 12 %   Monocytes Absolute 0.9 0.1 - 1.0 K/uL   Eosinophils Relative 2 0 - 5 %   Eosinophils Absolute 0.2 0.0 - 0.7 K/uL   Basophils Relative 0 0 - 1 %   Basophils Absolute 0.0 0.0 - 0.1 K/uL  Comprehensive metabolic panel  Result Value Ref Range   Sodium 135 135 - 145 mmol/L   Potassium 3.8 3.5 - 5.1 mmol/L   Chloride 99 96 - 112 mEq/L   CO2 29 19 - 32 mmol/L   Glucose, Bld 102 (H) 70 - 99 mg/dL   BUN 13 6 - 23 mg/dL   Creatinine, Ser 1.14 0.50 - 1.35 mg/dL   Calcium 9.5 8.4 - 10.5 mg/dL   Total Protein 6.9 6.0 - 8.3 g/dL   Albumin 4.3 3.5 - 5.2 g/dL   AST 31 0 -  37 U/L   ALT 23 0 - 53 U/L   Alkaline Phosphatase 57 39 - 117 U/L   Total Bilirubin 1.0 0.3 - 1.2 mg/dL   GFR calc non Af Amer 78 (L) >90 mL/min   GFR calc Af Amer >90 >90 mL/min   Anion gap 7 5 - 15  Lipase, blood  Result Value Ref Range   Lipase 41 11 - 59 U/L  Urinalysis, Routine  w reflex microscopic  Result Value Ref Range   Color, Urine YELLOW YELLOW   APPearance CLEAR CLEAR   Specific Gravity, Urine 1.015 1.005 - 1.030   pH 6.0 5.0 - 8.0   Glucose, UA NEGATIVE NEGATIVE mg/dL   Hgb urine dipstick NEGATIVE NEGATIVE   Bilirubin Urine NEGATIVE NEGATIVE   Ketones, ur NEGATIVE NEGATIVE mg/dL   Protein, ur NEGATIVE NEGATIVE mg/dL   Urobilinogen, UA 0.2 0.0 - 1.0 mg/dL   Nitrite NEGATIVE NEGATIVE   Leukocytes, UA NEGATIVE NEGATIVE      Radiological Exams on Admission: Ct Abdomen Pelvis W Contrast  01/24/2014   CLINICAL DATA:  All over abdominal pain onset 6 p.m. last evening. Nausea and vomiting. History of Crohn's disease.  EXAM: CT ABDOMEN AND PELVIS WITH CONTRAST  TECHNIQUE: Multidetector CT imaging of the abdomen and pelvis was performed using the standard protocol following bolus administration of intravenous contrast.  CONTRAST:  145mL OMNIPAQUE IOHEXOL 300 MG/ML  SOLN  COMPARISON:  01/27/2013  FINDINGS: Mild dependent changes in the lung bases.  The liver, spleen, gallbladder, pancreas, adrenal glands, kidneys, normal item abdominal aorta, inferior vena cava, and retroperitoneal lymph nodes are unremarkable. Stomach appears normal. There is dilated fluid-filled small bowel with transition zone in the left lower quadrant consistent with small bowel obstruction. B ileum at the transition zone appears to demonstrate wall thickening and will bump with mural enhancement consistent with inflammatory stricture from Crohn's disease. There is also involvement of the terminal ileum. The colon is stool filled but decompressed. No free air or free fluid in the abdomen.  Pelvis: Small amount of  free fluid in the pelvis. This is likely reactive. Mild wall thickening in the bladder may be reactive or due to urinary tract infection. No pelvic mass or lymphadenopathy. Prostate gland is not enlarged. No normal alignment of the lumbar spine. No destructive bone lesions. Move  IMPRESSION: Small bowel obstruction with transition zone in the left lower quadrant mid ileum. Ileum demonstrates wall thickening and enhancement consistent with inflammatory changes due to Crohn's disease. Inflammatory changes also suggested in the sigmoid colon. Small amount of free fluid in the pelvis is likely reactive.   Electronically Signed   By: Lucienne Capers M.D.   On: 01/24/2014 06:07      Orders placed or performed during the hospital encounter of 10/25/13  . EKG 12-Lead  . EKG 12-Lead  . EKG     Assessment/Plan Principal Problem:   Crohn's ileitis Active Problems:   SBO (small bowel obstruction)   Crohn disease   Dehydration  Crohn's flare c SBO - NGT, NPO, IV steroids - given Solumedrol 125 in ED and will do 60 mg IV BID from here, IV Zofran, IV Protonix, IV fluids with potassium.  GI consulted.  May need Surgical consult.  Admit IV Dilaudid as needed for pain ESR and CRP   DVT proph Full code    Tiernan Millikin M 01/24/2014, 7:30 AM

## 2014-01-24 NOTE — ED Provider Notes (Signed)
CSN: [id number]     Arrival date & time 01/24/14  0865 History  This chart was scribed for Shawn Mackie, MD by Gwenyth Ober, ED Scribe. This patient was seen in room B14C/B14C and the patient's care was started at 3:33 AM.     Chief Complaint  Patient presents with  . Abdominal Pain   The history is provided by the patient. No language interpreter was used.    HPI Comments: Shawn Dougherty is a 44 y.o. male with a history of Crohn's Disease and SBO who presents to the Emergency Department complaining of constant, generalized abdominal pain and nausea that started 9 hours ago. He has a history of similar flare-ups associated with Crohn's Disease and states current symptoms are consistent with prior bowel obstructions "where his small intestine meets the large intestine." Pt states that flare-ups normally resolve on their own or are decompressed. He denies a history of abdominal surgeries. In the course of 15 years, pt has had 8-10 small bowel obstructions. His last SBO was 1 year ago. Pt states that his last BM was 7.5 hours ago. Pt takes Pentasa 3200 mg daily. Pt denies any other problems associated with Crohn's disease. He also denies vomiting as an associated symptom.  Past Medical History  Diagnosis Date  . Crohn's disease   . Crohn disease   . SBO (small bowel obstruction)    Past Surgical History  Procedure Laterality Date  . Hernia repair     No family history on file. History  Substance Use Topics  . Smoking status: Never Smoker   . Smokeless tobacco: Not on file  . Alcohol Use: Yes    Review of Systems  Gastrointestinal: Positive for nausea and abdominal pain. Negative for vomiting.  All other systems reviewed and are negative.  Allergies  Clindamycin/lincomycin and Penicillins  Home Medications   Prior to Admission medications   Medication Sig Start Date End Date Taking? Authorizing Provider  mesalamine (PENTASA) 500 MG CR capsule Take 1,000-1,500 mg by mouth 3  (three) times daily. 3 capsules (1500 mg) every morning and every night, 2 capsules (1000 mg) at noon - after meals    Historical Provider, MD  Multiple Vitamin (MULTIVITAMIN WITH MINERALS) TABS tablet Take 0.5-1 tablets by mouth daily.    Historical Provider, MD  oxyCODONE-acetaminophen (PERCOCET) 5-325 MG per tablet Take 1 tablet by mouth every 4 (four) hours as needed for severe pain. 10/25/13   Audree Camel, MD   BP 136/91 mmHg  Pulse 67  Temp(Src) 98.1 F (36.7 C) (Oral)  Resp 16  SpO2 99% Physical Exam  Constitutional: He is oriented to person, place, and time. He appears well-developed and well-nourished. No distress.  HENT:  Head: Normocephalic and atraumatic.  Nose: Nose normal.  Mouth/Throat: Oropharynx is clear and moist.  Eyes: Conjunctivae and EOM are normal. Pupils are equal, round, and reactive to light.  Neck: Normal range of motion. Neck supple. No JVD present. No tracheal deviation present. No thyromegaly present.  Cardiovascular: Normal rate, regular rhythm, normal heart sounds and intact distal pulses.  Exam reveals no gallop and no friction rub.   No murmur heard. Pulmonary/Chest: Effort normal and breath sounds normal. No stridor. No respiratory distress. He has no wheezes. He has no rales. He exhibits no tenderness.  Abdominal: Soft. Bowel sounds are normal. He exhibits no distension and no mass. There is tenderness (patient has diffuse tenderness worse in the right lower quadrant.). There is no rebound and no guarding.  Musculoskeletal: Normal range of motion. He exhibits no edema or tenderness.  Lymphadenopathy:    He has no cervical adenopathy.  Neurological: He is alert and oriented to person, place, and time. He displays normal reflexes. He exhibits normal muscle tone. Coordination normal.  Skin: Skin is warm and dry. No rash noted. No erythema. No pallor.  Psychiatric: He has a normal mood and affect. His behavior is normal. Judgment and thought content  normal.  Nursing note and vitals reviewed.   ED Course  Procedures (including critical care time) DIAGNOSTIC STUDIES: Oxygen Saturation is 99% on RA, normal by my interpretation.    COORDINATION OF CARE: 3:35 AM Discussed treatment plan with pt at bedside and pt agreed to plan.    Labs Review Labs Reviewed  COMPREHENSIVE METABOLIC PANEL - Abnormal; Notable for the following:    Glucose, Bld 102 (*)    GFR calc non Af Amer 78 (*)    All other components within normal limits  CBC WITH DIFFERENTIAL  LIPASE, BLOOD  URINALYSIS, ROUTINE W REFLEX MICROSCOPIC    Imaging Review No results found.   EKG Interpretation None     Results for orders placed or performed during the hospital encounter of 01/24/14  CBC with Differential  Result Value Ref Range   WBC 9.1 4.0 - 10.5 K/uL   RBC 5.55 4.22 - 5.81 MIL/uL   Hemoglobin 15.8 13.0 - 17.0 g/dL   HCT 37.6 28.3 - 15.1 %   MCV 86.3 78.0 - 100.0 fL   MCH 28.5 26.0 - 34.0 pg   MCHC 33.0 30.0 - 36.0 g/dL   RDW 76.1 60.7 - 37.1 %   Platelets 274 150 - 400 K/uL   Neutrophils Relative % 67 43 - 77 %   Neutro Abs 6.1 1.7 - 7.7 K/uL   Lymphocytes Relative 21 12 - 46 %   Lymphs Abs 2.0 0.7 - 4.0 K/uL   Monocytes Relative 10 3 - 12 %   Monocytes Absolute 0.9 0.1 - 1.0 K/uL   Eosinophils Relative 2 0 - 5 %   Eosinophils Absolute 0.2 0.0 - 0.7 K/uL   Basophils Relative 0 0 - 1 %   Basophils Absolute 0.0 0.0 - 0.1 K/uL  Comprehensive metabolic panel  Result Value Ref Range   Sodium 135 135 - 145 mmol/L   Potassium 3.8 3.5 - 5.1 mmol/L   Chloride 99 96 - 112 mEq/L   CO2 29 19 - 32 mmol/L   Glucose, Bld 102 (H) 70 - 99 mg/dL   BUN 13 6 - 23 mg/dL   Creatinine, Ser 0.62 0.50 - 1.35 mg/dL   Calcium 9.5 8.4 - 69.4 mg/dL   Total Protein 6.9 6.0 - 8.3 g/dL   Albumin 4.3 3.5 - 5.2 g/dL   AST 31 0 - 37 U/L   ALT 23 0 - 53 U/L   Alkaline Phosphatase 57 39 - 117 U/L   Total Bilirubin 1.0 0.3 - 1.2 mg/dL   GFR calc non Af Amer 78 (L) >90  mL/min   GFR calc Af Amer >90 >90 mL/min   Anion gap 7 5 - 15  Lipase, blood  Result Value Ref Range   Lipase 41 11 - 59 U/L  Urinalysis, Routine w reflex microscopic  Result Value Ref Range   Color, Urine YELLOW YELLOW   APPearance CLEAR CLEAR   Specific Gravity, Urine 1.015 1.005 - 1.030   pH 6.0 5.0 - 8.0   Glucose, UA NEGATIVE NEGATIVE mg/dL  Hgb urine dipstick NEGATIVE NEGATIVE   Bilirubin Urine NEGATIVE NEGATIVE   Ketones, ur NEGATIVE NEGATIVE mg/dL   Protein, ur NEGATIVE NEGATIVE mg/dL   Urobilinogen, UA 0.2 0.0 - 1.0 mg/dL   Nitrite NEGATIVE NEGATIVE   Leukocytes, UA NEGATIVE NEGATIVE   Ct Abdomen Pelvis W Contrast  01/24/2014   CLINICAL DATA:  All over abdominal pain onset 6 p.m. last evening. Nausea and vomiting. History of Crohn's disease.  EXAM: CT ABDOMEN AND PELVIS WITH CONTRAST  TECHNIQUE: Multidetector CT imaging of the abdomen and pelvis was performed using the standard protocol following bolus administration of intravenous contrast.  CONTRAST:  OMNIPAQUE IOHEXOL 300 MG/ML  SOLN  COMPARISON:  01/27/2013  FINDINGS: Mild dependent changes in the lung bases.  The liver, spleen, gallbladder, pancreas, adrenal glands, kidneys, normal item abdominal aorta, inferior vena cava, and retroperitoneal lymph nodes are unremarkable. Stomach appears normal. There is dilated fluid-filled small bowel with transition zone in the left lower quadrant consistent with small bowel obstruction. B ileum at the transition zone appears to demonstrate wall thickening and will bump with mural enhancement consistent with inflammatory stricture from Crohn's disease. There is also involvement of the terminal ileum. The colon is stool filled but decompressed. No free air or free fluid in the abdomen.  Pelvis: Small amount of free fluid in the pelvis. This is likely reactive. Mild wall thickening in the bladder may be reactive or due to urinary tract infection. No pelvic mass or lymphadenopathy. Prostate  gland is not enlarged. No normal alignment of the lumbar spine. No destructive bone lesions. Move  IMPRESSION: Small bowel obstruction with transition zone in the left lower quadrant mid ileum. Ileum demonstrates wall thickening and enhancement consistent with inflammatory changes due to Crohn's disease. Inflammatory changes also suggested in the sigmoid colon. Small amount of free fluid in the pelvis is likely reactive.   Electronically Signed   By: Burman Nieves M.D.   On: 01/24/2014 06:07     MDM   Final diagnoses:  Abdominal pain, acute  Small bowel obstruction  Crohn disease, other complication    44 year old male history of Crohn disease with onset of abdominal pain earlier this evening.  Patient has history of frequent small bowel obstructions due to stricture at terminal ileum.  Plan for labs, CT abdomen pelvis.  Patient has not required surgery in the past.  Abdomen is soft, no rebound or guarding to suggest acute abdomen.  I personally performed the services described in this documentation, which was scribed in my presence. The recorded information has been reviewed and is accurate.  7:08 AM Case d/w Dr Madilyn Fireman, on call for [facility].  They will see in consult.  Pt updated on findings and plan.  Case d/w Dr Timothy Lasso who will see patient and admit.   Shawn Mackie, MD 01/24/14 [phone]

## 2014-01-25 ENCOUNTER — Inpatient Hospital Stay (HOSPITAL_COMMUNITY): Payer: BC Managed Care – PPO

## 2014-01-25 LAB — COMPREHENSIVE METABOLIC PANEL
ALT: 19 U/L (ref 0–53)
ANION GAP: 5 (ref 5–15)
AST: 23 U/L (ref 0–37)
Albumin: 3.5 g/dL (ref 3.5–5.2)
Alkaline Phosphatase: 57 U/L (ref 39–117)
BUN: 12 mg/dL (ref 6–23)
CHLORIDE: 105 meq/L (ref 96–112)
CO2: 27 mmol/L (ref 19–32)
CREATININE: 0.97 mg/dL (ref 0.50–1.35)
Calcium: 8.6 mg/dL (ref 8.4–10.5)
GFR calc Af Amer: >90 mL/min (ref >90)
GFR calc non Af Amer: >90 mL/min (ref >90)
Glucose, Bld: 143 mg/dL — ABNORMAL HIGH (ref 70–99)
Potassium: 4 mmol/L (ref 3.5–5.1)
Sodium: 137 mmol/L (ref 135–145)
Total Bilirubin: 1 mg/dL (ref 0.3–1.2)
Total Protein: 6 g/dL (ref 6.0–8.3)

## 2014-01-25 LAB — C-REACTIVE PROTEIN: CRP: 1.3 mg/dL — AB (ref <0.60)

## 2014-01-25 MED ORDER — PREDNISONE 10 MG PO TABS: ORAL_TABLET | ORAL | Status: DC

## 2014-01-25 MED ORDER — MESALAMINE ER 250 MG PO CPCR
1000.0000 mg | ORAL_CAPSULE | Freq: Three times a day (TID) | ORAL | Status: DC
  Administered 2014-01-25: 1000 mg via ORAL
  Filled 2014-01-25 (×3): qty 4

## 2014-01-25 MED ORDER — LORATADINE 10 MG PO TABS: 10.0000 mg | ORAL_TABLET | Freq: Every day | ORAL | Status: DC | PRN

## 2014-01-25 MED ORDER — LORATADINE 10 MG PO TABS
10.0000 mg | ORAL_TABLET | Freq: Every day | ORAL | Status: DC | PRN
Start: 2014-01-25 — End: 2014-01-25

## 2014-01-25 NOTE — Progress Notes (Signed)
Subjective: Admitted 1/2 c SBO due to Crohn's Flare. Feels much better. KUB is better Wants tube out and wants to go home. Has Flatus and loose BM (Not diarrhea)  Objective: Vital signs in last 24 hours: Temp:  [98.1 F (36.7 C)-98.9 F (37.2 C)] 98.3 F (36.8 C) (01/03 0611) Pulse Rate:  [70-79] 77 (01/03 0611) Resp:  [17-18] 17 (01/03 0611) BP: (112-116)/(72-80) 116/80 mmHg (01/03 0611) SpO2:  [95 %-96 %] 95 % (01/03 0611) Weight change:     CBG (last 3)  No results for input(s): GLUCAP in the last 72 hours.  Intake/Output from previous day: No intake or output data in the 24 hours ending 01/25/14 0911     Physical Exam  General appearance: a and o nare - NGT in place  Eyes: no scleral icterus Throat: oropharynx moist without erythema Resp: CTA Cardio: Reg GI: soft, much less bloat, Non tender.  BS + Extremities: no clubbing, cyanosis or edema   Lab Results:  Recent Labs  01/24/14 0256 01/25/14 0555  NA 135 137  K 3.8 4.0  CL 99 105  CO2 29 27  GLUCOSE 102* 143*  BUN 13 12  CREATININE 1.14 0.97  CALCIUM 9.5 8.6     Recent Labs  01/24/14 0256 01/25/14 0555  AST 31 23  ALT 23 19  ALKPHOS 57 57  BILITOT 1.0 1.0  PROT 6.9 6.0  ALBUMIN 4.3 3.5     Recent Labs  01/24/14 0256  WBC 9.1  NEUTROABS 6.1  HGB 15.8  HCT 47.9  MCV 86.3  PLT 274    No results found for: INR, PROTIME  No results for input(s): CKTOTAL, CKMB, CKMBINDEX, TROPONINI in the last 72 hours.  No results for input(s): TSH, T4TOTAL, T3FREE, THYROIDAB in the last 72 hours.  Invalid input(s): FREET3  No results for input(s): VITAMINB12, FOLATE, FERRITIN, TIBC, IRON, RETICCTPCT in the last 72 hours.  Micro Results: No results found for this or any previous visit (from the past 240 hour(s)).   Studies/Results: Ct Abdomen Pelvis W Contrast  01/24/2014   CLINICAL DATA:  All over abdominal pain onset 6 p.m. last evening. Nausea and vomiting. History of Crohn's disease.   EXAM: CT ABDOMEN AND PELVIS WITH CONTRAST  TECHNIQUE: Multidetector CT imaging of the abdomen and pelvis was performed using the standard protocol following bolus administration of intravenous contrast.  CONTRAST:  132mL OMNIPAQUE IOHEXOL 300 MG/ML  SOLN  COMPARISON:  01/27/2013  FINDINGS: Mild dependent changes in the lung bases.  The liver, spleen, gallbladder, pancreas, adrenal glands, kidneys, normal item abdominal aorta, inferior vena cava, and retroperitoneal lymph nodes are unremarkable. Stomach appears normal. There is dilated fluid-filled small bowel with transition zone in the left lower quadrant consistent with small bowel obstruction. B ileum at the transition zone appears to demonstrate wall thickening and will bump with mural enhancement consistent with inflammatory stricture from Crohn's disease. There is also involvement of the terminal ileum. The colon is stool filled but decompressed. No free air or free fluid in the abdomen.  Pelvis: Small amount of free fluid in the pelvis. This is likely reactive. Mild wall thickening in the bladder may be reactive or due to urinary tract infection. No pelvic mass or lymphadenopathy. Prostate gland is not enlarged. No normal alignment of the lumbar spine. No destructive bone lesions. Move  IMPRESSION: Small bowel obstruction with transition zone in the left lower quadrant mid ileum. Ileum demonstrates wall thickening and enhancement consistent with inflammatory changes due to  Crohn's disease. Inflammatory changes also suggested in the sigmoid colon. Small amount of free fluid in the pelvis is likely reactive.   Electronically Signed   By: Lucienne Capers M.D.   On: 01/24/2014 06:07   Dg Abd 2 Views  01/25/2014   CLINICAL DATA:  Small bowel obstruction.  Crohn disease.  EXAM: ABDOMEN - 2 VIEW  COMPARISON:  CT of 1 day prior  FINDINGS: Supine and upright views. This supine view demonstrates improvement in small bowel distension. Example loop measures 3.4 cm  today versus 4.4 cm on yesterday's CT. Contrast identified within the colon.  Upright view demonstrates no free intraperitoneal air or significant air-fluid levels. Nasogastric terminates at body of the stomach.  IMPRESSION: Improvement in small bowel obstruction without acute complication.   Electronically Signed   By: Abigail Miyamoto M.D.   On: 01/25/2014 08:44     Medications: Scheduled: . enoxaparin (LOVENOX) injection  40 mg Subcutaneous Q24H   Continuous:    Assessment/Plan: Principal Problem:   Crohn's ileitis Active Problems:   SBO (small bowel obstruction)   Crohn disease   Dehydration   Crohn's flare c SBO - NGT - current KUB - Improvement in small bowel obstruction without acute complication. Remove NGT.  Advance diet. Switch to PO steroids after this one IV solumedrol. Hep lock Appreciate GI input. If tolerates diet D/c later today.  ESR 3 and CRP P  DVT proph Full code  LOS: 1 day   Corrine Tillis M 01/25/2014, 9:11 AM

## 2014-01-25 NOTE — Discharge Instructions (Signed)
Small Bowel Obstruction °A small bowel obstruction is a blockage (obstruction) of the small intestine (small bowel). The small bowel is a long, slender tube that connects the stomach to the colon. Its job is to absorb nutrients from the fluids and foods you consume into the bloodstream.  °CAUSES  °There are many causes of intestinal blockage. The most common ones include: °· Hernias. This is a more common cause in children than adults. °· Inflammatory bowel disease (enteritis and colitis). °· Twisting of the bowel (volvulus). °· Tumors. °· Scar tissue (adhesions) from previous surgery or radiation treatment. °· Recent surgery. This may cause an acute small bowel obstruction called an ileus. °SYMPTOMS  °· Abdominal pain. This may be dull cramps or sharp pain. It may occur in one area or may be present in the entire abdomen. Pain can range from mild to severe, depending on the degree of obstruction. °· Nausea and vomiting. Vomit may be greenish or yellow bile color. °· Distended or swollen stomach. Abdominal bloating is a common symptom. °· Constipation. °· Lack of passing gas. °· Frequent belching. °· Diarrhea. This may occur if runny stool is able to leak around the obstruction. °DIAGNOSIS  °Your caregiver can usually diagnose small bowel obstruction by taking a history, doing a physical exam, and taking X-rays. If the cause is unclear, a CT scan (computerized tomography) of your abdomen and pelvis may be needed. °TREATMENT  °Treatment of the blockage depends on the cause and how bad the problem is.  °· Sometimes, the obstruction improves with bed rest and intravenous (IV) fluids. °· Resting the bowel is very important. This means following a simple diet. Sometimes, a clear liquid diet may be required for several days. °· Sometimes, a small tube (nasogastric tube) is placed into the stomach to decompress the bowel. When the bowel is blocked, it usually swells up like a balloon filled with air and fluids.  Decompression means that the air and fluids are removed by suction through that tube. This can help with pain, discomfort, and nausea. It can also help the obstruction resolve faster. °· Surgery may be required if other treatments do not work. Bowel obstruction from a hernia may require early surgery and can be an emergency procedure. Adhesions that cause frequent or severe obstructions may also require surgery. °HOME CARE INSTRUCTIONS °If your bowel obstruction is only partial or incomplete, you may be allowed to go home. °· Get plenty of rest. °· Follow your diet as directed by your caregiver. °· Only consume clear liquids until your condition improves. °· Avoid solid foods as instructed. °SEEK IMMEDIATE MEDICAL CARE IF: °· You have increased pain or cramping. °· You vomit blood. °· You have uncontrolled vomiting or nausea. °· You cannot drink fluids due to vomiting or pain. °· You develop confusion. °· You begin feeling very dry or thirsty (dehydrated). °· You have severe bloating. °· You have chills. °· You have a fever. °· You feel extremely weak or you faint. °MAKE SURE YOU: °· Understand these instructions. °· Will watch your condition. °· Will get help right away if you are not doing well or get worse. °Document Released: 03/28/2005 Document Revised: 04/03/2011 Document Reviewed: 03/25/2010 °[employer]® Patient Information ©2015 [employer]. This information is not intended to replace advice given to you by your health care provider. Make sure you discuss any questions you have with your health care provider. ° °

## 2014-01-25 NOTE — Discharge Summary (Signed)
Physician Discharge Summary  DISCHARGE SUMMARY   Patient ID: Tayquan Gassman MR#: [id number] DOB/AGE: 43-09-1970 43 y.o.   Attending Physician:Amarien Carne M  Patient's WUJ:WJXBJYN,WGNFAOZ A, MD  Consults:Treatment Team:  Barrie Folk, MD**  Admit date: 01/24/2014 Discharge date: 01/25/2014  Discharge Diagnoses:  Principal Problem:   Crohn's ileitis Active Problems:   SBO (small bowel obstruction)   Crohn disease   Dehydration   Patient Active Problem List   Diagnosis Date Noted  . Crohn's ileitis 01/24/2014  . SBO (small bowel obstruction) 01/27/2013  . Crohn disease 01/27/2013  . Dehydration 01/27/2013   Past Medical History  Diagnosis Date  . Crohn's disease   . Crohn disease   . SBO (small bowel obstruction)     Discharged Condition: stable   Discharge Medications:   Medication List    TAKE these medications        loratadine 10 MG tablet  Commonly known as:  CLARITIN  Take 1 tablet (10 mg total) by mouth daily as needed for allergies, rhinitis or itching.     mesalamine 500 MG CR capsule  Commonly known as:  PENTASA  Take 1,000-1,500 mg by mouth 3 (three) times daily. 3 capsules (1500 mg) every morning and every night, 2 capsules (1000 mg) at noon - after meals     multivitamin with minerals Tabs tablet  Take 0.5-1 tablets by mouth daily.     oxyCODONE-acetaminophen 5-325 MG per tablet  Commonly known as:  PERCOCET  Take 1 tablet by mouth every 4 (four) hours as needed for severe pain.     predniSONE 10 MG tablet  Commonly known as:  DELTASONE  4 tabs = 40 mg PO Daily times 2 days, then 2 tabs = 20 mg PO Daily times 2 days, then 1 tab = 10 mg PO Daily times 2 days, then 1/2 tab = 5 mg PO Daily times 2 days        Hospital Procedures: Ct Abdomen Pelvis W Contrast  01/24/2014   CLINICAL DATA:  All over abdominal pain onset 6 p.m. last evening. Nausea and vomiting. History of Crohn's disease.  EXAM: CT ABDOMEN AND PELVIS WITH CONTRAST  TECHNIQUE:  Multidetector CT imaging of the abdomen and pelvis was performed using the standard protocol following bolus administration of intravenous contrast.  CONTRAST:  OMNIPAQUE IOHEXOL 300 MG/ML  SOLN  COMPARISON:  01/27/2013  FINDINGS: Mild dependent changes in the lung bases.  The liver, spleen, gallbladder, pancreas, adrenal glands, kidneys, normal item abdominal aorta, inferior vena cava, and retroperitoneal lymph nodes are unremarkable. Stomach appears normal. There is dilated fluid-filled small bowel with transition zone in the left lower quadrant consistent with small bowel obstruction. B ileum at the transition zone appears to demonstrate wall thickening and will bump with mural enhancement consistent with inflammatory stricture from Crohn's disease. There is also involvement of the terminal ileum. The colon is stool filled but decompressed. No free air or free fluid in the abdomen.  Pelvis: Small amount of free fluid in the pelvis. This is likely reactive. Mild wall thickening in the bladder may be reactive or due to urinary tract infection. No pelvic mass or lymphadenopathy. Prostate gland is not enlarged. No normal alignment of the lumbar spine. No destructive bone lesions. Move  IMPRESSION: Small bowel obstruction with transition zone in the left lower quadrant mid ileum. Ileum demonstrates wall thickening and enhancement consistent with inflammatory changes due to Crohn's disease. Inflammatory changes also suggested in the sigmoid colon. Small amount of  free fluid in the pelvis is likely reactive.   Electronically Signed   By: Burman Nieves M.D.   On: 01/24/2014 06:07   Dg Abd 2 Views  01/25/2014   CLINICAL DATA:  Small bowel obstruction.  Crohn disease.  EXAM: ABDOMEN - 2 VIEW  COMPARISON:  CT of 1 day prior  FINDINGS: Supine and upright views. This supine view demonstrates improvement in small bowel distension. Example loop measures 3.4 cm today versus 4.4 cm on yesterday's CT. Contrast  identified within the colon.  Upright view demonstrates no free intraperitoneal air or significant air-fluid levels. Nasogastric terminates at body of the stomach.  IMPRESSION: Improvement in small bowel obstruction without acute complication.   Electronically Signed   By: Jeronimo Greaves M.D.   On: 01/25/2014 08:44    History of Present Illness: Present and was admitted c SBO d/t Crohns ileitis flare on 1/2.  Hospital Course: Present and was admitted c SBO d/t Crohns ileitis flare on 1/2. NGT placed. IVF started, IV Protonix and IV Solumedrol given Pt better on 1/3 KUB better c Improvement in small bowel obstruction without acute complication NGT removed - Meds converted to oral. Fed and advanced diet t liq then solids. Has Flatus and loose BM (Not diarrhea) No pains. Bloating gone Sent home c PO Prednisone and continue Mesalamine. Will need GI follow up. He was back to baseline.   Day of Discharge Exam BP 116/80 mmHg  Pulse 77  Temp(Src) 98.3 F (36.8 C) (Oral)  Resp 17  SpO2 95%  Physical Exam: See PN this am  Discharge Labs:  Recent Labs  01/24/14 0256 01/25/14 0555  NA 135 137  K 3.8 4.0  CL 99 105  CO2 29 27  GLUCOSE 102* 143*  BUN 13 12  CREATININE 1.14 0.97  CALCIUM 9.5 8.6    Recent Labs  01/24/14 0256 01/25/14 0555  AST 31 23  ALT 23 19  ALKPHOS 57 57  BILITOT 1.0 1.0  PROT 6.9 6.0  ALBUMIN 4.3 3.5    Recent Labs  01/24/14 0256  WBC 9.1  NEUTROABS 6.1  HGB 15.8  HCT 47.9  MCV 86.3  PLT 274   No results for input(s): CKTOTAL, CKMB, CKMBINDEX, TROPONINI in the last 72 hours. No results for input(s): TSH, T4TOTAL, T3FREE, THYROIDAB in the last 72 hours.  Invalid input(s): FREET3 No results for input(s): VITAMINB12, FOLATE, FERRITIN, TIBC, IRON, RETICCTPCT in the last 72 hours. No results found for: INR, PROTIME     Discharge instructions:  01-Home or Self Care     Follow-up Information    Follow up with EDWARDS JR,JAMES L, MD  In 2 weeks.   Specialty:  Gastroenterology   Contact information:   [address]                          [place] [address] [phone]        Disposition: home  Follow-up Appts: Follow-up with Dr. Jacky Kindle  at [facility] as scheduled  Condition on Discharge: stable  Tests Needing Follow-up: none  Time spent in discharge (includes decision making & examination of pt): 20 min  Signed: Aylinn Rydberg M 01/25/2014, 12:39 PM

## 2014-01-25 NOTE — Progress Notes (Signed)
DC NG tube and IV per MD orders; patient tolerated a clear liquid diet and progressed to regular diet without any problems; DC instructions reviewed with patient; patient eager to leave before noon, no further questions from patient.  Hermina Barters, RN

## 2015-07-19 DIAGNOSIS — Z0000 Encounter for general adult medical examination without abnormal findings: Secondary | ICD-10-CM | POA: Diagnosis not present

## 2015-09-30 DIAGNOSIS — M25551 Pain in right hip: Secondary | ICD-10-CM | POA: Diagnosis not present

## 2016-02-28 DIAGNOSIS — K5090 Crohn's disease, unspecified, without complications: Secondary | ICD-10-CM | POA: Diagnosis not present

## 2016-02-28 DIAGNOSIS — Z0000 Encounter for general adult medical examination without abnormal findings: Secondary | ICD-10-CM | POA: Diagnosis not present

## 2016-02-28 DIAGNOSIS — Z125 Encounter for screening for malignant neoplasm of prostate: Secondary | ICD-10-CM | POA: Diagnosis not present

## 2016-03-06 DIAGNOSIS — E663 Overweight: Secondary | ICD-10-CM | POA: Diagnosis not present

## 2016-03-06 DIAGNOSIS — Z1389 Encounter for screening for other disorder: Secondary | ICD-10-CM | POA: Diagnosis not present

## 2016-03-06 DIAGNOSIS — Z6827 Body mass index (BMI) 27.0-27.9, adult: Secondary | ICD-10-CM | POA: Diagnosis not present

## 2016-03-06 DIAGNOSIS — K5090 Crohn's disease, unspecified, without complications: Secondary | ICD-10-CM | POA: Diagnosis not present

## 2016-03-06 DIAGNOSIS — Z0000 Encounter for general adult medical examination without abnormal findings: Secondary | ICD-10-CM | POA: Diagnosis not present

## 2016-03-08 DIAGNOSIS — Z1212 Encounter for screening for malignant neoplasm of rectum: Secondary | ICD-10-CM | POA: Diagnosis not present

## 2016-05-24 DIAGNOSIS — M7061 Trochanteric bursitis, right hip: Secondary | ICD-10-CM | POA: Diagnosis not present

## 2016-05-24 DIAGNOSIS — M7631 Iliotibial band syndrome, right leg: Secondary | ICD-10-CM | POA: Diagnosis not present

## 2016-05-24 DIAGNOSIS — S7001XA Contusion of right hip, initial encounter: Secondary | ICD-10-CM | POA: Diagnosis not present

## 2016-09-05 DIAGNOSIS — Z0000 Encounter for general adult medical examination without abnormal findings: Secondary | ICD-10-CM | POA: Diagnosis not present

## 2016-09-07 DIAGNOSIS — K5090 Crohn's disease, unspecified, without complications: Secondary | ICD-10-CM | POA: Diagnosis not present

## 2016-09-07 DIAGNOSIS — R1084 Generalized abdominal pain: Secondary | ICD-10-CM | POA: Diagnosis not present

## 2016-09-07 DIAGNOSIS — Z6826 Body mass index (BMI) 26.0-26.9, adult: Secondary | ICD-10-CM | POA: Diagnosis not present

## 2016-09-08 DIAGNOSIS — R1084 Generalized abdominal pain: Secondary | ICD-10-CM | POA: Diagnosis not present

## 2016-09-08 DIAGNOSIS — R300 Dysuria: Secondary | ICD-10-CM | POA: Diagnosis not present

## 2016-09-08 DIAGNOSIS — K50019 Crohn's disease of small intestine with unspecified complications: Secondary | ICD-10-CM | POA: Diagnosis not present

## 2016-09-11 ENCOUNTER — Other Ambulatory Visit: Payer: Self-pay | Admitting: Physician Assistant

## 2016-09-11 DIAGNOSIS — R1084 Generalized abdominal pain: Secondary | ICD-10-CM

## 2016-09-11 DIAGNOSIS — K50019 Crohn's disease of small intestine with unspecified complications: Secondary | ICD-10-CM

## 2016-09-18 DIAGNOSIS — K633 Ulcer of intestine: Secondary | ICD-10-CM | POA: Diagnosis not present

## 2016-09-18 DIAGNOSIS — K5080 Crohn's disease of both small and large intestine without complications: Secondary | ICD-10-CM | POA: Diagnosis not present

## 2016-09-18 DIAGNOSIS — K5289 Other specified noninfective gastroenteritis and colitis: Secondary | ICD-10-CM | POA: Diagnosis not present

## 2016-09-20 ENCOUNTER — Ambulatory Visit
Admission: RE | Admit: 2016-09-20 | Discharge: 2016-09-20 | Disposition: A | Discharge status: Home / Self Care | Payer: BLUE CROSS/BLUE SHIELD | Source: Ambulatory Visit | Attending: Physician Assistant | Admitting: Physician Assistant

## 2016-09-20 DIAGNOSIS — R1084 Generalized abdominal pain: Secondary | ICD-10-CM | POA: Diagnosis not present

## 2016-09-20 DIAGNOSIS — K50019 Crohn's disease of small intestine with unspecified complications: Secondary | ICD-10-CM

## 2016-09-20 MED ORDER — IOPAMIDOL (ISOVUE-300) INJECTION 61%
100.0000 mL | Freq: Once | INTRAVENOUS | Status: AC | PRN
  Administered 2016-09-20: 100 mL via INTRAVENOUS

## 2016-09-21 DIAGNOSIS — K5289 Other specified noninfective gastroenteritis and colitis: Secondary | ICD-10-CM | POA: Diagnosis not present

## 2016-09-22 DIAGNOSIS — K50019 Crohn's disease of small intestine with unspecified complications: Secondary | ICD-10-CM | POA: Diagnosis not present

## 2016-09-27 DIAGNOSIS — K50019 Crohn's disease of small intestine with unspecified complications: Secondary | ICD-10-CM | POA: Diagnosis not present

## 2016-09-29 ENCOUNTER — Ambulatory Visit
Admission: RE | Admit: 2016-09-29 | Discharge: 2016-09-29 | Disposition: A | Discharge status: Home / Self Care | Payer: BLUE CROSS/BLUE SHIELD | Source: Ambulatory Visit | Attending: Physician Assistant | Admitting: Physician Assistant

## 2016-09-29 ENCOUNTER — Other Ambulatory Visit: Payer: Self-pay | Admitting: Physician Assistant

## 2016-09-29 DIAGNOSIS — R7611 Nonspecific reaction to tuberculin skin test without active tuberculosis: Secondary | ICD-10-CM | POA: Diagnosis not present

## 2016-09-29 DIAGNOSIS — R7612 Nonspecific reaction to cell mediated immunity measurement of gamma interferon antigen response without active tuberculosis: Secondary | ICD-10-CM

## 2016-11-01 DIAGNOSIS — K5000 Crohn's disease of small intestine without complications: Secondary | ICD-10-CM | POA: Diagnosis not present

## 2016-11-02 DIAGNOSIS — Z23 Encounter for immunization: Secondary | ICD-10-CM | POA: Diagnosis not present

## 2017-03-07 DIAGNOSIS — Z125 Encounter for screening for malignant neoplasm of prostate: Secondary | ICD-10-CM | POA: Diagnosis not present

## 2017-03-07 DIAGNOSIS — Z0000 Encounter for general adult medical examination without abnormal findings: Secondary | ICD-10-CM | POA: Diagnosis not present

## 2017-03-14 DIAGNOSIS — K5090 Crohn's disease, unspecified, without complications: Secondary | ICD-10-CM | POA: Diagnosis not present

## 2017-03-14 DIAGNOSIS — Z1389 Encounter for screening for other disorder: Secondary | ICD-10-CM | POA: Diagnosis not present

## 2017-03-14 DIAGNOSIS — Z0000 Encounter for general adult medical examination without abnormal findings: Secondary | ICD-10-CM | POA: Diagnosis not present

## 2017-03-14 DIAGNOSIS — Z6826 Body mass index (BMI) 26.0-26.9, adult: Secondary | ICD-10-CM | POA: Diagnosis not present

## 2017-03-14 DIAGNOSIS — E663 Overweight: Secondary | ICD-10-CM | POA: Diagnosis not present

## 2017-03-15 DIAGNOSIS — Z1212 Encounter for screening for malignant neoplasm of rectum: Secondary | ICD-10-CM | POA: Diagnosis not present

## 2017-06-01 DIAGNOSIS — H1089 Other conjunctivitis: Secondary | ICD-10-CM | POA: Diagnosis not present

## 2017-06-01 DIAGNOSIS — H00014 Hordeolum externum left upper eyelid: Secondary | ICD-10-CM | POA: Diagnosis not present

## 2017-06-09 DIAGNOSIS — H6501 Acute serous otitis media, right ear: Secondary | ICD-10-CM | POA: Diagnosis not present

## 2017-08-22 DIAGNOSIS — M222X2 Patellofemoral disorders, left knee: Secondary | ICD-10-CM | POA: Diagnosis not present

## 2017-08-22 DIAGNOSIS — M25562 Pain in left knee: Secondary | ICD-10-CM | POA: Diagnosis not present

## 2017-08-29 DIAGNOSIS — M25562 Pain in left knee: Secondary | ICD-10-CM | POA: Diagnosis not present

## 2017-09-04 DIAGNOSIS — Z0000 Encounter for general adult medical examination without abnormal findings: Secondary | ICD-10-CM | POA: Diagnosis not present

## 2017-10-01 DIAGNOSIS — M222X2 Patellofemoral disorders, left knee: Secondary | ICD-10-CM | POA: Diagnosis not present

## 2017-10-10 DIAGNOSIS — M25562 Pain in left knee: Secondary | ICD-10-CM | POA: Diagnosis not present

## 2017-10-19 DIAGNOSIS — M25562 Pain in left knee: Secondary | ICD-10-CM | POA: Diagnosis not present

## 2017-10-19 DIAGNOSIS — M1712 Unilateral primary osteoarthritis, left knee: Secondary | ICD-10-CM | POA: Diagnosis not present

## 2017-11-02 DIAGNOSIS — Z23 Encounter for immunization: Secondary | ICD-10-CM | POA: Diagnosis not present

## 2017-12-03 DIAGNOSIS — M1712 Unilateral primary osteoarthritis, left knee: Secondary | ICD-10-CM | POA: Diagnosis not present

## 2018-04-15 DIAGNOSIS — Z0000 Encounter for general adult medical examination without abnormal findings: Secondary | ICD-10-CM | POA: Diagnosis not present

## 2018-04-15 DIAGNOSIS — Z125 Encounter for screening for malignant neoplasm of prostate: Secondary | ICD-10-CM | POA: Diagnosis not present

## 2018-04-15 DIAGNOSIS — E663 Overweight: Secondary | ICD-10-CM | POA: Diagnosis not present

## 2018-04-22 DIAGNOSIS — Z0000 Encounter for general adult medical examination without abnormal findings: Secondary | ICD-10-CM | POA: Diagnosis not present

## 2018-04-22 DIAGNOSIS — J45990 Exercise induced bronchospasm: Secondary | ICD-10-CM | POA: Diagnosis not present

## 2018-04-22 DIAGNOSIS — K5090 Crohn's disease, unspecified, without complications: Secondary | ICD-10-CM | POA: Diagnosis not present

## 2018-04-22 DIAGNOSIS — E663 Overweight: Secondary | ICD-10-CM | POA: Diagnosis not present

## 2018-04-22 DIAGNOSIS — Z1331 Encounter for screening for depression: Secondary | ICD-10-CM | POA: Diagnosis not present

## 2018-05-06 DIAGNOSIS — K5000 Crohn's disease of small intestine without complications: Secondary | ICD-10-CM | POA: Diagnosis not present

## 2018-06-12 DIAGNOSIS — H6123 Impacted cerumen, bilateral: Secondary | ICD-10-CM | POA: Diagnosis not present

## 2018-06-12 DIAGNOSIS — H9201 Otalgia, right ear: Secondary | ICD-10-CM | POA: Diagnosis not present

## 2018-07-19 DIAGNOSIS — Z20828 Contact with and (suspected) exposure to other viral communicable diseases: Secondary | ICD-10-CM | POA: Diagnosis not present

## 2019-05-16 ENCOUNTER — Ambulatory Visit (INDEPENDENT_AMBULATORY_CARE_PROVIDER_SITE_OTHER): Payer: BC Managed Care – PPO

## 2019-05-16 ENCOUNTER — Ambulatory Visit: Payer: BC Managed Care – PPO | Admitting: Podiatry

## 2019-05-16 ENCOUNTER — Other Ambulatory Visit: Payer: Self-pay

## 2019-05-16 VITALS — Temp 97.1°F

## 2019-05-16 DIAGNOSIS — M25572 Pain in left ankle and joints of left foot: Secondary | ICD-10-CM | POA: Diagnosis not present

## 2019-05-16 DIAGNOSIS — M79672 Pain in left foot: Secondary | ICD-10-CM

## 2019-05-16 DIAGNOSIS — M7752 Other enthesopathy of left foot: Secondary | ICD-10-CM | POA: Diagnosis not present

## 2019-05-29 ENCOUNTER — Telehealth: Payer: Self-pay | Admitting: Podiatry

## 2019-05-29 DIAGNOSIS — K5000 Crohn's disease of small intestine without complications: Secondary | ICD-10-CM | POA: Diagnosis not present

## 2019-05-29 NOTE — Telephone Encounter (Signed)
Left message for pt to call to discuss orthotic benefits.

## 2019-05-30 ENCOUNTER — Telehealth: Payer: Self-pay | Admitting: Podiatry

## 2019-05-30 NOTE — Telephone Encounter (Signed)
Pt left message returning my call.  I returned his and left message with benefit information. [id number] valid and billable and insurance does not guarantee payment but coverage is 100% if billed with office visit and pt pays 40.00 copay. Pt is scheduled to see you 5.14.2021 and can me scan or measured at that time.

## 2019-06-06 ENCOUNTER — Ambulatory Visit: Payer: BC Managed Care – PPO | Admitting: Podiatry

## 2019-06-06 ENCOUNTER — Other Ambulatory Visit: Payer: Self-pay

## 2019-06-06 DIAGNOSIS — M7752 Other enthesopathy of left foot: Secondary | ICD-10-CM

## 2019-06-06 DIAGNOSIS — M79672 Pain in left foot: Secondary | ICD-10-CM | POA: Diagnosis not present

## 2019-06-06 NOTE — Progress Notes (Signed)
  Subjective:  Patient ID: Shawn Dougherty, male    DOB: 1970-12-19,  MRN: [id number]  Chief Complaint  Patient presents with  . Foot Pain    Left foot sub 2nd/3rd capsulitis follow up. Pt states some minor improvement but still experiencing pain.     49 y.o. male presents with the above complaint. History confirmed with patient.   Objective:  Physical Exam: warm, good capillary refill, no trophic changes or ulcerative lesions, normal DP and PT pulses and normal sensory exam. Left Foot: POP left 2nd/3rd MPJs no pain at interspace  Right Foot: normal exam, no swelling, tenderness, instability; ligaments intact, full range of motion of all ankle/foot joints   Assessment:   1. Capsulitis of metatarsophalangeal (MTP) joint of left foot   2. Pain of left foot     Plan:  Patient was evaluated and treated and all questions answered.  Capsulitis -Improving. Defer injection today -Dispense silicone padding -F/u PRN should issues persist. Consider surgical intervention.  No follow-ups on file.

## 2019-06-10 ENCOUNTER — Other Ambulatory Visit: Payer: Self-pay | Admitting: Podiatry

## 2019-06-10 DIAGNOSIS — M7752 Other enthesopathy of left foot: Secondary | ICD-10-CM

## 2019-07-07 NOTE — Progress Notes (Signed)
  Subjective:  Patient ID: Shawn Dougherty, male    DOB: 16-Nov-1970,  MRN: [id number]  Chief Complaint  Patient presents with  . Foot Pain    L plantar forefoot, submet 1-2. x6 weeks. Pt stated, "Pain was up to 7/10 initially. 3/10 today. 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 I've used ice, Dr. Margart Sickles metatarsal pads, and insoles".    49 y.o. male presents with the above complaint. History confirmed with patient.   Objective:  Physical Exam: warm, good capillary refill, no trophic changes or ulcerative lesions, normal DP and PT pulses and normal sensory exam. Left Foot: POP 2nd MPJ  Right Foot: normal exam, no swelling, tenderness, instability; ligaments intact, full range of motion of all ankle/foot joints   No images are attached to the encounter.  Radiographs: X-ray of the left foot: no fracture, dislocation, swelling or degenerative changes noted Assessment:   1. Capsulitis of metatarsophalangeal (MTP) joint of left foot   2. Pain in joint of left foot    Plan:  Patient was evaluated and treated and all questions answered.  Capsulitis -Educated on etiology -XR reviewed with patient -Discussed padding and proper shoegear -Offloading pad applied -Injection delivered to the painful joint  Procedure: Joint Injection Location: Left 2nd MPJ Skin Prep: Alcohol. Injectate: 0.5 cc 1% lidocaine plain, 0.5 cc dexamethasone phosphate. Disposition: Patient tolerated procedure well. Injection site dressed with a band-aid.   Return in about 3 weeks (around 06/06/2019) for Capsulitis.

## 2019-07-12 DIAGNOSIS — W231XXA Caught, crushed, jammed, or pinched between stationary objects, initial encounter: Secondary | ICD-10-CM | POA: Diagnosis not present

## 2019-07-12 DIAGNOSIS — S92534A Nondisplaced fracture of distal phalanx of right lesser toe(s), initial encounter for closed fracture: Secondary | ICD-10-CM | POA: Diagnosis not present

## 2019-07-12 DIAGNOSIS — S92532A Displaced fracture of distal phalanx of left lesser toe(s), initial encounter for closed fracture: Secondary | ICD-10-CM | POA: Diagnosis not present

## 2019-07-12 DIAGNOSIS — M79674 Pain in right toe(s): Secondary | ICD-10-CM | POA: Diagnosis not present

## 2019-07-16 DIAGNOSIS — B078 Other viral warts: Secondary | ICD-10-CM | POA: Diagnosis not present

## 2019-07-16 DIAGNOSIS — D485 Neoplasm of uncertain behavior of skin: Secondary | ICD-10-CM | POA: Diagnosis not present

## 2019-08-06 DIAGNOSIS — M25572 Pain in left ankle and joints of left foot: Secondary | ICD-10-CM | POA: Diagnosis not present

## 2019-08-06 DIAGNOSIS — M79672 Pain in left foot: Secondary | ICD-10-CM | POA: Diagnosis not present

## 2019-08-06 DIAGNOSIS — M7752 Other enthesopathy of left foot: Secondary | ICD-10-CM | POA: Diagnosis not present

## 2019-08-06 DIAGNOSIS — G5762 Lesion of plantar nerve, left lower limb: Secondary | ICD-10-CM | POA: Diagnosis not present

## 2019-08-20 DIAGNOSIS — G5762 Lesion of plantar nerve, left lower limb: Secondary | ICD-10-CM | POA: Diagnosis not present

## 2019-08-20 DIAGNOSIS — M65872 Other synovitis and tenosynovitis, left ankle and foot: Secondary | ICD-10-CM | POA: Diagnosis not present

## 2019-09-03 DIAGNOSIS — M25572 Pain in left ankle and joints of left foot: Secondary | ICD-10-CM | POA: Diagnosis not present

## 2019-09-03 DIAGNOSIS — M7752 Other enthesopathy of left foot: Secondary | ICD-10-CM | POA: Diagnosis not present

## 2019-09-03 DIAGNOSIS — Z20822 Contact with and (suspected) exposure to covid-19: Secondary | ICD-10-CM | POA: Diagnosis not present

## 2019-09-09 DIAGNOSIS — Z20822 Contact with and (suspected) exposure to covid-19: Secondary | ICD-10-CM | POA: Diagnosis not present

## 2019-09-25 DIAGNOSIS — R936 Abnormal findings on diagnostic imaging of limbs: Secondary | ICD-10-CM | POA: Diagnosis not present

## 2019-09-30 DIAGNOSIS — M25572 Pain in left ankle and joints of left foot: Secondary | ICD-10-CM | POA: Diagnosis not present

## 2019-09-30 DIAGNOSIS — M7752 Other enthesopathy of left foot: Secondary | ICD-10-CM | POA: Diagnosis not present

## 2019-10-19 DIAGNOSIS — W19XXXA Unspecified fall, initial encounter: Secondary | ICD-10-CM | POA: Diagnosis not present

## 2019-10-19 DIAGNOSIS — M25532 Pain in left wrist: Secondary | ICD-10-CM | POA: Diagnosis not present

## 2019-10-19 DIAGNOSIS — S63502A Unspecified sprain of left wrist, initial encounter: Secondary | ICD-10-CM | POA: Diagnosis not present

## 2019-10-28 DIAGNOSIS — M7752 Other enthesopathy of left foot: Secondary | ICD-10-CM | POA: Diagnosis not present

## 2019-10-28 DIAGNOSIS — M25572 Pain in left ankle and joints of left foot: Secondary | ICD-10-CM | POA: Diagnosis not present

## 2019-11-11 DIAGNOSIS — M722 Plantar fascial fibromatosis: Secondary | ICD-10-CM | POA: Diagnosis not present

## 2019-11-11 DIAGNOSIS — M25572 Pain in left ankle and joints of left foot: Secondary | ICD-10-CM | POA: Diagnosis not present

## 2019-11-11 DIAGNOSIS — M2580 Other specified joint disorders, unspecified joint: Secondary | ICD-10-CM | POA: Diagnosis not present

## 2019-11-11 DIAGNOSIS — M7752 Other enthesopathy of left foot: Secondary | ICD-10-CM | POA: Diagnosis not present

## 2021-01-13 ENCOUNTER — Encounter (HOSPITAL_COMMUNITY): Payer: Self-pay | Admitting: *Deleted

## 2021-01-13 ENCOUNTER — Emergency Department (HOSPITAL_COMMUNITY): Payer: 59

## 2021-01-13 ENCOUNTER — Observation Stay (HOSPITAL_COMMUNITY)
Admission: EM | Admit: 2021-01-13 | Discharge: 2021-01-13 | Disposition: A | Discharge status: Home / Self Care | Payer: 59 | Attending: Internal Medicine | Admitting: Internal Medicine

## 2021-01-13 ENCOUNTER — Other Ambulatory Visit: Payer: Self-pay

## 2021-01-13 DIAGNOSIS — K5669 Other partial intestinal obstruction: Secondary | ICD-10-CM | POA: Insufficient documentation

## 2021-01-13 DIAGNOSIS — K50118 Crohn's disease of large intestine with other complication: Secondary | ICD-10-CM | POA: Diagnosis not present

## 2021-01-13 DIAGNOSIS — K56600 Partial intestinal obstruction, unspecified as to cause: Secondary | ICD-10-CM | POA: Diagnosis not present

## 2021-01-13 DIAGNOSIS — K50919 Crohn's disease, unspecified, with unspecified complications: Secondary | ICD-10-CM

## 2021-01-13 DIAGNOSIS — R109 Unspecified abdominal pain: Secondary | ICD-10-CM | POA: Diagnosis present

## 2021-01-13 DIAGNOSIS — Z20822 Contact with and (suspected) exposure to covid-19: Secondary | ICD-10-CM | POA: Insufficient documentation

## 2021-01-13 DIAGNOSIS — K5090 Crohn's disease, unspecified, without complications: Secondary | ICD-10-CM | POA: Diagnosis present

## 2021-01-13 DIAGNOSIS — R1084 Generalized abdominal pain: Secondary | ICD-10-CM

## 2021-01-13 LAB — CBC WITH DIFFERENTIAL/PLATELET
Abs Immature Granulocytes: 0.08 10*3/uL — ABNORMAL HIGH (ref 0.00–0.07)
Basophils Absolute: 0 10*3/uL (ref 0.0–0.1)
Basophils Relative: 0 %
Eosinophils Absolute: 0.1 10*3/uL (ref 0.0–0.5)
Eosinophils Relative: 2 %
HCT: 46.5 % (ref 39.0–52.0)
Hemoglobin: 15.7 g/dL (ref 13.0–17.0)
Immature Granulocytes: 1 %
Lymphocytes Relative: 12 %
Lymphs Abs: 1.2 10*3/uL (ref 0.7–4.0)
MCH: 29.5 pg (ref 26.0–34.0)
MCHC: 33.8 g/dL (ref 30.0–36.0)
MCV: 87.2 fL (ref 80.0–100.0)
Monocytes Absolute: 0.5 10*3/uL (ref 0.1–1.0)
Monocytes Relative: 5 %
Neutro Abs: 7.7 10*3/uL (ref 1.7–7.7)
Neutrophils Relative %: 80 %
Platelets: 240 10*3/uL (ref 150–400)
RBC: 5.33 MIL/uL (ref 4.22–5.81)
RDW: 13.7 % (ref 11.5–15.5)
WBC: 9.6 10*3/uL (ref 4.0–10.5)
nRBC: 0 % (ref 0.0–0.2)

## 2021-01-13 LAB — COMPREHENSIVE METABOLIC PANEL
ALT: 23 U/L (ref 0–44)
AST: 30 U/L (ref 15–41)
Albumin: 3.9 g/dL (ref 3.5–5.0)
Alkaline Phosphatase: 42 U/L (ref 38–126)
Anion gap: 9 (ref 5–15)
BUN: 14 mg/dL (ref 6–20)
CO2: 26 mmol/L (ref 22–32)
Calcium: 9.3 mg/dL (ref 8.9–10.3)
Chloride: 101 mmol/L (ref 98–111)
Creatinine, Ser: 1.08 mg/dL (ref 0.61–1.24)
GFR, Estimated: >60 mL/min (ref >60)
Glucose, Bld: 106 mg/dL — ABNORMAL HIGH (ref 70–99)
Potassium: 4 mmol/L (ref 3.5–5.1)
Sodium: 136 mmol/L (ref 135–145)
Total Bilirubin: 1 mg/dL (ref 0.3–1.2)
Total Protein: 6.8 g/dL (ref 6.5–8.1)

## 2021-01-13 LAB — RESP PANEL BY RT-PCR (FLU A&B, COVID) ARPGX2
Influenza A by PCR: NEGATIVE
Influenza B by PCR: NEGATIVE
SARS Coronavirus 2 by RT PCR: NEGATIVE

## 2021-01-13 LAB — LIPASE, BLOOD: Lipase: 52 U/L — ABNORMAL HIGH (ref 11–51)

## 2021-01-13 MED ORDER — ONDANSETRON HCL 4 MG/2ML IJ SOLN
4.0000 mg | Freq: Four times a day (QID) | INTRAMUSCULAR | Status: DC | PRN
  Filled 2021-01-13: qty 2

## 2021-01-13 MED ORDER — MESALAMINE 1.2 G PO TBEC
1.2000 g | DELAYED_RELEASE_TABLET | Freq: Every day | ORAL | Status: DC
Start: 2021-01-13 — End: 2021-01-13
  Administered 2021-01-13: 12:00:00 1.2 g via ORAL
  Filled 2021-01-13: qty 1

## 2021-01-13 MED ORDER — ACETAMINOPHEN 325 MG PO TABS: 650.0000 mg | ORAL_TABLET | Freq: Four times a day (QID) | ORAL | Status: DC | PRN

## 2021-01-13 MED ORDER — IOHEXOL 350 MG/ML SOLN
80.0000 mL | Freq: Once | INTRAVENOUS | Status: AC | PRN
  Administered 2021-01-13: 08:00:00 80 mL via INTRAVENOUS

## 2021-01-13 MED ORDER — LACTATED RINGERS IV SOLN: INTRAVENOUS | Status: DC

## 2021-01-13 MED ORDER — PREDNISONE 10 MG PO TABS: 40.0000 mg | ORAL_TABLET | Freq: Every day | ORAL | 0 refills | Status: AC

## 2021-01-13 MED ORDER — ONDANSETRON HCL 4 MG PO TABS: 4.0000 mg | ORAL_TABLET | Freq: Four times a day (QID) | ORAL | Status: DC | PRN

## 2021-01-13 MED ORDER — ACETAMINOPHEN 650 MG RE SUPP: 650.0000 mg | Freq: Four times a day (QID) | RECTAL | Status: DC | PRN

## 2021-01-13 MED ORDER — ONDANSETRON 4 MG PO TBDP
4.0000 mg | ORAL_TABLET | Freq: Once | ORAL | Status: AC
  Administered 2021-01-13: 07:00:00 4 mg via ORAL
  Filled 2021-01-13: qty 1

## 2021-01-13 MED ORDER — ENOXAPARIN SODIUM 40 MG/0.4ML IJ SOSY
40.0000 mg | PREFILLED_SYRINGE | INTRAMUSCULAR | Status: DC
  Administered 2021-01-13: 12:00:00 40 mg via SUBCUTANEOUS
  Filled 2021-01-13: qty 0.4

## 2021-01-13 MED ORDER — HYDRALAZINE HCL 20 MG/ML IJ SOLN: 5.0000 mg | INTRAMUSCULAR | Status: DC | PRN

## 2021-01-13 MED ORDER — METHYLPREDNISOLONE SODIUM SUCC 125 MG IJ SOLR
80.0000 mg | INTRAMUSCULAR | Status: DC
  Administered 2021-01-13: 12:00:00 80 mg via INTRAVENOUS
  Filled 2021-01-13: qty 2

## 2021-01-13 MED ORDER — OXYCODONE-ACETAMINOPHEN 5-325 MG PO TABS
1.0000 | ORAL_TABLET | Freq: Once | ORAL | Status: AC
  Administered 2021-01-13: 07:00:00 1 via ORAL
  Filled 2021-01-13: qty 1

## 2021-01-13 MED ORDER — MORPHINE SULFATE (PF) 2 MG/ML IV SOLN
2.0000 mg | INTRAVENOUS | Status: DC | PRN
  Filled 2021-01-13: qty 1

## 2021-01-13 NOTE — ED Notes (Signed)
Pt called this RN to the room and stated he wanted to speak with the dr if possible. Pt stated "you know it is much louder back here than where I was and it's just a lot, but I honestly feel a lot better and would actually like to go home." Pt stated "Francesca Oman are not giving me pain meds or nausea medicine only fluids and a steroid and surely I could do that at home." This RN informed pt that she understood and that she would reach out to admitting with the pt's concern. This RN left the room and spoke with Dr. Ophelia Charter who agrees to come and see the pt when she can. This RN will continue to monitor.

## 2021-01-13 NOTE — Assessment & Plan Note (Addendum)
-  Patient with prior h/o SBO associated with Crohn's presenting with acute onset of abdominal pain with nausea, abdominal distention, and CT findings c/w partial SBO -Observed on Med Surg, improved and requested d/c to home later in the afternoon -Patient was encouraged to remain hospitalized overnight for ongoing monitoring but insisted on dc; he does not appear ill enough to require discharge against medical advice -Will discharge with strict return instructions

## 2021-01-13 NOTE — H&P (Addendum)
History and Physical    Patient: Shawn Dougherty EML:[id number] DOB: 1970/11/16 DOA: 01/13/2021 DOS: the patient was seen and examined on 01/13/2021 PCP: Burnard Bunting, MD  Patient coming from: Home; NOK: Wife, Gavriel Holzhauer, [phone]  Chief Complaint: Abdominal pain  HPI: Shawn Dougherty is a 50 y.o. male with medical history significant of Crohn's disease and SBO presenting with abdominal pain.  At 11pm, he noticed acute onset of abdominal pain.  It was similar to past issues with Crohn's.  He tends to have a blockage every few years due to Crohn's flare.  [place] to [place] of [place] and so decided to come in early.  He has required an NG tube in the past and usually then he is fine for another few years.  +nausea, no vomiting.  Last BM was about 130AM, soft but not loose.  The distention and pain have improved since arrival in the ER.   ER Course:  Partial SBO, h/o similar and last in 2016. Needs NG tube placement and decompression.  Given Zofran, Percocet.  VSS stable.  Has never needed surgery involvement in the past.    Review of Systems: ROS Past Medical History:  Diagnosis Date   Crohn's disease ([place])    SBO (small bowel obstruction) ([place])    Past Surgical History:  Procedure Laterality Date   HERNIA REPAIR     Social History:  reports that he has never smoked. He does not have any smokeless tobacco history on file. He reports current alcohol use. He reports that he does not use drugs.  Allergies  Allergen Reactions   Clindamycin/Lincomycin Hives   Penicillins Other (See Comments)    Difficulty breathing, tongue swells    History reviewed. No pertinent family history.  Prior to Admission medications   Medication Sig Start Date End Date Taking? Authorizing Provider  diclofenac Sodium (VOLTAREN) 1 % GEL Voltaren 1 % topical gel  APPLY 4 GRAMS TO THE AFFECTED AREA(S) BY TOPICAL ROUTE 4 TIMES PER DAY    [provider]  influenza  vac split quadrivalent PF (FLUZONE QUADRIVALENT) 0.5 ML injection Fluzone Quad 2019-2020 (PF) 60 mcg (15 mcg x 4)/0.5 mL IM syringe  TO BE ADMINISTERED BY PHARMACIST FOR IMMUNIZATION    [provider]  loratadine (CLARITIN) 10 MG tablet Take 1 tablet (10 mg total) by mouth daily as needed for allergies, rhinitis or itching. 01/25/14   Shon Baton, MD  mesalamine (PENTASA) 500 MG CR capsule Take 1,000-1,500 mg by mouth 3 (three) times daily. 3 capsules (1500 mg) every morning and every night, 2 capsules (1000 mg) at noon - after meals    [provider]  Multiple Vitamin (MULTIVITAMIN WITH MINERALS) TABS tablet Take 0.5-1 tablets by mouth daily.    [provider]  oxyCODONE-acetaminophen (PERCOCET) 5-325 MG per tablet Take 1 tablet by mouth every 4 (four) hours as needed for severe pain. 10/25/13   Sherwood Gambler, MD  predniSONE (DELTASONE) 10 MG tablet 4 tabs = 40 mg PO Daily times 2 days, then 2 tabs = 20 mg PO Daily times 2 days, then 1 tab = 10 mg PO Daily times 2 days, then 1/2 tab = 5 mg PO Daily times 2 days 01/25/14   Shon Baton, MD    Physical Exam: Vitals:   01/13/21 0601 01/13/21 0630 01/13/21 0819  BP: 124/90  121/84  Pulse: 72  60  Resp: 20  18  Temp: 98 F (36.7 C)  98 F (  36.7 C)  TempSrc: Oral  Oral  SpO2: 100%  100%  Weight:  77.1 kg   Height:  5' 7"  (1.702 m)    General:  Appears calm and comfortable and is in NAD Eyes:  PERRL, EOMI, normal lids, iris ENT:  grossly normal hearing, lips & tongue, mmm; appropriate dentition Neck:  no LAD, masses or thyromegaly Cardiovascular:  RRR, no m/r/g. No LE edema.  Respiratory:   CTA bilaterally with no wheezes/rales/rhonchi.  Normal respiratory effort. Abdomen:  soft, mildly diffusely TTP, mildly distended, hypoactive but present BS Skin:  no rash or induration seen on limited exam Musculoskeletal:  grossly normal tone BUE/BLE, good ROM, no bony abnormality Psychiatric:  grossly normal mood and affect,  speech fluent and appropriate, AOx3 Neurologic:  CN 2-12 grossly intact, moves all extremities in coordinated fashion  Data Reviewed: Pertinent labs:  Unremarkable CMP Normal CBC COVID/flu negative  CT: IMPRESSION: 1. Proximal to mid small bowel dilatation with decompressed distal small bowel. Findings most consistent with partial small bowel obstruction, likely due to adhesions. No evidence of active enteritis. 2. Trace abdominopelvic fluid, likely secondary. 3.  Aortic Atherosclerosis (ICD10-I70.0).  Assessment/Plan * Partial small bowel obstruction (HCC)- (present on admission) -Patient with prior h/o SBO associated with Crohn's presenting with acute onset of abdominal pain with nausea, abdominal distention, and CT findings c/w partial SBO -Will admit to Med Surg -He already has bowel sounds and feels the need to pass gas/have a BM so hopefully this will be a short recovery -NG tube deferred for now -IVF hydration -Pain control with morphine  Crohn disease ([place])- (present on admission) -Patient with h/o Crohn's, on mesalamine -Symptoms are consistent with Crohn's flare, but mild -Mild leukocytosis -Low suspicion for infectious etiology so will not start antibiotics at this time -Will give gentle IVF hydration and patient will be on clear liquids for now -Pain control with low-dose morphine if needed - although he prefers not to use -Solumedrol 80 mg IV daily -Will need to follow up with GI either as inpatient or outpatient     Advance Care Planning:   Code Status: Full Code   Consults: None  Family Communication: None present  Severity of Illness: It is my clinical opinion that referral for OBSERVATION is reasonable and necessary in this patient based on the above information provided. The aforementioned taken together are felt to place the patient at high risk for further clinical deterioration. However it is anticipated that the patient may be medically stable for  discharge from the hospital within 24 to 48 hours.   Author: Karmen Bongo 01/13/2021 10:26 AM  For on call review [email or web address].

## 2021-01-13 NOTE — ED Provider Notes (Signed)
[facility] EMERGENCY DEPARTMENT Provider Note   CSN: [id number] Arrival date & time: 01/13/21  0555     History Chief Complaint  Patient presents with   Abdominal Pain    Shawn Dougherty is a 50 y.o. male.  Patient with history of Crohn's disease and SBO presents today with chief complaint of abdominal pain.  He states that his pain began around 11 PM last night.  History of several small bowel obstructions in the past, he states that this pain has felt the same when he does have an SBO.  States he tried to sleep it off, however he woke up around 3 AM with significant increase in pain which prompted his ER visit.  He states that his last bowel movement was around 1:30 AM and was normal for him.  He has been passing flatus normally.  Endorses some nausea without vomiting.  States that normally when he has an SBO he received steroids and has an NG tube placed for decompression which is successful.  No history of abdominal surgeries.  Takes mesalamine daily for his Crohn's disease.    Upon my assessment does endorse that he has had some improvement in his pain over the past hour, however he did receive Percocet and Zofran  The history is provided by the patient. No language interpreter was used.  Abdominal Pain Associated symptoms: nausea   Associated symptoms: no chest pain, no chills, no constipation, no diarrhea, no fever, no shortness of breath and no vomiting       Past Medical History:  Diagnosis Date   Crohn disease ([place])    Crohn's disease ([place])    SBO (small bowel obstruction) ([place])     Patient Active Problem List   Diagnosis Date Noted   Pain in left knee 08/22/2017   Crohn's ileitis ([place]) 01/24/2014   SBO (small bowel obstruction) ([place]) 01/27/2013   Crohn disease ([place]) 01/27/2013   Dehydration 01/27/2013    Past Surgical History:  Procedure Laterality Date   HERNIA REPAIR         No family history on file.  Social History   Tobacco Use    Smoking status: Never  Substance Use Topics   Alcohol use: Yes   Drug use: No    Home Medications Prior to Admission medications   Medication Sig Start Date End Date Taking? Authorizing Provider  diclofenac Sodium (VOLTAREN) 1 % GEL Voltaren 1 % topical gel  APPLY 4 GRAMS TO THE AFFECTED AREA(S) BY TOPICAL ROUTE 4 TIMES PER DAY    [provider]  influenza vac split quadrivalent PF (FLUZONE QUADRIVALENT) 0.5 ML injection Fluzone Quad 2019-2020 (PF) 60 mcg (15 mcg x 4)/0.5 mL IM syringe  TO BE ADMINISTERED BY PHARMACIST FOR IMMUNIZATION    [provider]  loratadine (CLARITIN) 10 MG tablet Take 1 tablet (10 mg total) by mouth daily as needed for allergies, rhinitis or itching. 01/25/14   Shon Baton, MD  mesalamine (PENTASA) 500 MG CR capsule Take 1,000-1,500 mg by mouth 3 (three) times daily. 3 capsules (1500 mg) every morning and every night, 2 capsules (1000 mg) at noon - after meals    [provider]  Multiple Vitamin (MULTIVITAMIN WITH MINERALS) TABS tablet Take 0.5-1 tablets by mouth daily.    [provider]  oxyCODONE-acetaminophen (PERCOCET) 5-325 MG per tablet Take 1 tablet by mouth every 4 (four) hours as needed for severe pain. 10/25/13   Sherwood Gambler, MD  predniSONE (DELTASONE) 10 MG tablet  4 tabs = 40 mg PO Daily times 2 days, then 2 tabs = 20 mg PO Daily times 2 days, then 1 tab = 10 mg PO Daily times 2 days, then 1/2 tab = 5 mg PO Daily times 2 days 01/25/14   Creola Corn, MD    Allergies    Clindamycin/lincomycin and Penicillins  Review of Systems   Review of Systems  Constitutional:  Negative for chills and fever.  Respiratory:  Negative for shortness of breath.   Cardiovascular:  Negative for chest pain.  Gastrointestinal:  Positive for abdominal distention, abdominal pain and nausea. Negative for constipation, diarrhea and vomiting.  Psychiatric/Behavioral:  Negative for confusion and decreased concentration.   All other systems  reviewed and are negative.  Physical Exam Updated Vital Signs BP 121/84    Pulse 60    Temp 98 F (36.7 C) (Oral)    Resp 18    Ht 5\' 7"  (1.702 m)    Wt 77.1 kg    SpO2 100%    BMI 26.63 kg/m   Physical Exam Vitals and nursing note reviewed.  Constitutional:      General: He is not in acute distress.    Appearance: He is well-developed and normal weight. He is not ill-appearing, toxic-appearing or diaphoretic.  HENT:     Head: Normocephalic and atraumatic.  Cardiovascular:     Rate and Rhythm: Normal rate and regular rhythm.     Heart sounds: Normal heart sounds.  Pulmonary:     Effort: Pulmonary effort is normal. No respiratory distress.     Breath sounds: Normal breath sounds.  Abdominal:     General: Bowel sounds are normal. There is distension.     Palpations: Abdomen is soft.     Tenderness: There is generalized abdominal tenderness.  Skin:    General: Skin is warm and dry.  Neurological:     General: No focal deficit present.     Mental Status: He is alert.  Psychiatric:        Mood and Affect: Mood normal.        Behavior: Behavior normal.    ED Results / Procedures / Treatments   Labs (all labs ordered are listed, but only abnormal results are displayed) Labs Reviewed  COMPREHENSIVE METABOLIC PANEL - Abnormal; Notable for the following components:      Result Value   Glucose, Bld 106 (*)    All other components within normal limits  LIPASE, BLOOD - Abnormal; Notable for the following components:   Lipase 52 (*)    All other components within normal limits  CBC WITH DIFFERENTIAL/PLATELET - Abnormal; Notable for the following components:   Abs Immature Granulocytes 0.08 (*)    All other components within normal limits  RESP PANEL BY RT-PCR (FLU A&B, COVID) [id number]    EKG None  Radiology DG Abdomen 1 View  Result Date: 01/13/2021 CLINICAL DATA:  Abdominal distention. EXAM: ABDOMEN - 1 VIEW COMPARISON:  01/25/2014 FINDINGS: One-view abdomen shows no  gaseous distension of abdominal bowel loops measuring up to 4.7 cm diameter. No evidence for distended small bowel in the pelvis. Colon is nondistended with scattered areas of stool evident. Visualized bony anatomy unremarkable. IMPRESSION: Gaseous distention of abdominal small bowel up to 4.7 cm diameter. Imaging features suggest component of small bowel obstruction or ileus. CT imaging may prove helpful to further evaluate. Electronically Signed   By: 03/26/2014 M.D.   On: 01/13/2021 07:03   CT Abdomen Pelvis W Contrast  Result Date: 01/13/2021 CLINICAL DATA:  Crohn's exacerbation.  History of obstruction. EXAM: CT ABDOMEN AND PELVIS WITH CONTRAST TECHNIQUE: Multidetector CT imaging of the abdomen and pelvis was performed using the standard protocol following bolus administration of intravenous contrast. CONTRAST:  30mL OMNIPAQUE IOHEXOL 350 MG/ML SOLN COMPARISON:  09/20/2016 FINDINGS: Lower chest: Clear lung bases. Normal heart size without pericardial or pleural effusion. Hepatobiliary: Normal liver. Normal gallbladder, without biliary ductal dilatation. Pancreas: Normal, without mass or ductal dilatation. Spleen: Normal in size, without focal abnormality. Adrenals/Urinary Tract: Normal adrenal glands. Normal kidneys, without hydronephrosis. Normal urinary bladder. Stomach/Bowel: Normal stomach, without wall thickening. Normal colon. The terminal ileum is stool filled, without inflammation including on 46/3 and coronal image 41. Mid jejunal loops are dilated and fluid-filled, including at up to 3.8 cm on 42/3. The distal small bowel is decompressed. No focal transition point is identified. No evidence of active enteritis. Vascular/Lymphatic: Aortic atherosclerosis. No abdominopelvic adenopathy. Reproductive: Normal prostate. Other: Trace right pelvic fluid on 70/3. Trace fluid adjacent dilated left abdominal small bowel loops on 52/3. Musculoskeletal: Tiny periumbilical fat containing ventral wall hernia  on 46/3. No active sacroiliitis. Degenerate disc disease at the lumbosacral junction. Disc bulge at L4-5. IMPRESSION: 1. Proximal to mid small bowel dilatation with decompressed distal small bowel. Findings most consistent with partial small bowel obstruction, likely due to adhesions. No evidence of active enteritis. 2. Trace abdominopelvic fluid, likely secondary. 3.  Aortic Atherosclerosis (ICD10-I70.0). Electronically Signed   By: Abigail Miyamoto M.D.   On: 01/13/2021 08:30    Procedures Procedures   Medications Ordered in ED Medications  ondansetron (ZOFRAN-ODT) disintegrating tablet 4 mg (4 mg Oral Given 01/13/21 0646)  oxyCODONE-acetaminophen (PERCOCET/ROXICET) 5-325 MG per tablet 1 tablet (1 tablet Oral Given 01/13/21 0646)  iohexol (OMNIPAQUE) 350 MG/ML injection 80 mL (80 mLs Intravenous Contrast Given 01/13/21 [id number])    ED Course  I have reviewed the triage vital signs and the nursing notes.  Pertinent labs & imaging results that were available during my care of the patient were reviewed by me and considered in my medical decision making (see chart for details).    MDM Rules/Calculators/A&P                         Patient with history of Crohns with several SBOs in the past presents with pain similar to SBOs in the past. He has always been able to manage his bowel obstructions with NG tube placement, he has never had any abdominal surgery.   CBC without leukocytosis or anemia. Lipase slightly elevated at 52, likely incidental finding. No other lab abnormalities.  Abd x-ray reveals gaseous distention concerning for SBO. CT abd pelvis reveals partial SBO.   Upon assessment, patient states that he was initially in 10/10 pain, however was given percocet and zofran in triage with significant improvement. Given history of SBO and CT findings without surgical history, plan to admit to medicine for further management. Discussed with patient who is amenable with plan.  Discussed patient  with hospitalist who agrees to admit  Findings and plan of care discussed with supervising physician Dr. Johnney Killian who is in agreement.     Final Clinical Impression(s) / ED Diagnoses Final diagnoses:  Partial small bowel obstruction ([place])  Crohn's disease with complication, unspecified gastrointestinal tract location [facility])  Generalized abdominal pain    Rx / DC Orders ED Discharge Orders     None        Azka Steger, Judson Roch  A, PA-C 01/13/21 1010    Charlesetta Shanks, MD 01/18/21 [phone]

## 2021-01-13 NOTE — Assessment & Plan Note (Addendum)
-  Patient with h/o Crohn's, on mesalamine -Symptoms are consistent with Crohn's flare, but mild -Mild leukocytosis -Low suspicion for infectious etiology so will not start antibiotics at this time -Given gentle IVF hydration -Did not need pain control -Solumedrol 80 mg IV x 1 -> prednisone 40 mg daily x 4 additional days -Will need to follow up with GI as outpatient

## 2021-01-13 NOTE — ED Triage Notes (Signed)
Patient presents to ed c/o abd. Pain onset 11pm last night. C/o nausea no vomiting. States he has had bowel blockages in the past

## 2021-01-13 NOTE — ED Provider Notes (Addendum)
Emergency Medicine Provider Triage Evaluation Note  Shawn Dougherty , a 50 y.o. male  was evaluated in triage.  Pt complains of generalized abdominal pain, nausea, and decreased bowel gas output since 11 PM.  History of Crohn's disease, states that this feels consistent with his prior experience of bowel obstruction.  Managed with NG tube decompression in the past.  Review of Systems  Positive: Abdominal pain, nausea Negative: Fevers, chills, diarrhea  Physical Exam  BP 124/90 (BP Location: Right Arm)    Pulse 72    Temp 98 F (36.7 C) (Oral)    Resp 20    Ht 5\' 7"  (1.702 m)    Wt 77.1 kg    SpO2 100%    BMI 26.63 kg/m  Gen:   Awake, no distress   Resp:  Normal effort  MSK:   Moves extremities without difficulty  Other:  Abdomen is distended, generally tender to palpation with decreased bowel sounds  Medical Decision Making  Medically screening exam initiated at 6:33 AM.  Appropriate orders placed.  [name] was informed that the remainder of the evaluation will be completed by another provider, this initial triage assessment does not replace that evaluation, and the importance of remaining in the ED until their evaluation is complete.  Patient is uncomfortable.  Concern for possible bowel obstruction.  Patient requesting abdominal xray to begin workup. Work-up ordered.  This chart was dictated using voice recognition software, Dragon. Despite the best efforts of this provider to proofread and correct errors, errors may still occur which can change documentation meaning.    Phillips Climes, PA-C 01/13/21 0634    01/15/21, PA-C 01/13/21 01/15/21    [id number], MD 01/13/21 [phone]

## 2021-01-13 NOTE — Discharge Summary (Signed)
Physician Discharge Summary   Patient: Shawn Dougherty MRN: [id number] DOB: @DOB   Admit date:     01/13/2021  Discharge date: 01/13/21  Discharge Physician: Karmen Bongo   PCP: Burnard Bunting, MD   Recommendations at discharge: 1. Take prednisone x 5 days 2. Advance diet as tolerated to regular diet 3. Follow up with PCP/GI upon return from trip 4. Go to the ER if symptoms return      Discharge Diagnoses Principal Problem:   Partial small bowel obstruction (HCC) Active Problems:   Crohn disease ([place])  Resolved Problems:   * No resolved hospital problems. [facility] Course     * Partial small bowel obstruction (HCC)- (present on admission) -Patient with prior h/o SBO associated with Crohn's presenting with acute onset of abdominal pain with nausea, abdominal distention, and CT findings c/w partial SBO -Observed on Med Surg, improved and requested d/c to home later in the afternoon -Patient was encouraged to remain hospitalized overnight for ongoing monitoring but insisted on dc; he does not appear ill enough to require discharge against medical advice -Will discharge with strict return instructions  Crohn disease ([place])- (present on admission) -Patient with h/o Crohn's, on mesalamine -Symptoms are consistent with Crohn's flare, but mild -Mild leukocytosis -Low suspicion for infectious etiology so will not start antibiotics at this time -Given gentle IVF hydration -Did not need pain control -Solumedrol 80 mg IV x 1 -> prednisone 40 mg daily x 4 additional days -Will need to follow up with GI as outpatient      Consultants: None Procedures performed: None  Disposition: Home Diet recommendation: Clear liquid diet, advance as tolerated  DISCHARGE MEDICATION: Allergies as of 01/13/2021       Reactions   Clindamycin/lincomycin Hives   Penicillins Other (See Comments)   Difficulty breathing, tongue swells        Medication List     TAKE these  medications    mesalamine 0.375 g 24 hr capsule Commonly known as: APRISO Take 1.5 g by mouth daily.   predniSONE 10 MG tablet Commonly known as: DELTASONE Take 4 tablets (40 mg total) by mouth daily for 4 days. Start taking on: January 14, 2021         Discharge Exam: Danley Danker Weights   01/13/21 0630  Weight: 77.1 kg   General:  Appears calm and comfortable and is in NAD Eyes:  normal lids, iris ENT:  grossly normal hearing, lips & tongue Abdomen:  soft, NT, ND Skin:  no rash or induration seen on limited exam Musculoskeletal:  grossly normal tone BUE/BLE, no bony abnormality Psychiatric:  grossly normal mood and affect, speech fluent and appropriate, AOx3 Neurologic:  CN 2-12 grossly intact, moves all extremities in coordinated fashion   Condition at discharge: good  The results of significant diagnostics from this hospitalization (including imaging, microbiology, ancillary and laboratory) are listed below for reference.   Imaging Studies: DG Abdomen 1 View  Result Date: 01/13/2021 CLINICAL DATA:  Abdominal distention. EXAM: ABDOMEN - 1 VIEW COMPARISON:  01/25/2014 FINDINGS: One-view abdomen shows no gaseous distension of abdominal bowel loops measuring up to 4.7 cm diameter. No evidence for distended small bowel in the pelvis. Colon is nondistended with scattered areas of stool evident. Visualized bony anatomy unremarkable. IMPRESSION: Gaseous distention of abdominal small bowel up to 4.7 cm diameter. Imaging features suggest component of small bowel obstruction or ileus. CT imaging may prove helpful to further evaluate. Electronically Signed   By: Verda Cumins.D.  On: 01/13/2021 07:03   CT Abdomen Pelvis W Contrast  Result Date: 01/13/2021 CLINICAL DATA:  Crohn's exacerbation.  History of obstruction. EXAM: CT ABDOMEN AND PELVIS WITH CONTRAST TECHNIQUE: Multidetector CT imaging of the abdomen and pelvis was performed using the standard protocol following bolus  administration of intravenous contrast. CONTRAST:  40m OMNIPAQUE IOHEXOL 350 MG/ML SOLN COMPARISON:  09/20/2016 FINDINGS: Lower chest: Clear lung bases. Normal heart size without pericardial or pleural effusion. Hepatobiliary: Normal liver. Normal gallbladder, without biliary ductal dilatation. Pancreas: Normal, without mass or ductal dilatation. Spleen: Normal in size, without focal abnormality. Adrenals/Urinary Tract: Normal adrenal glands. Normal kidneys, without hydronephrosis. Normal urinary bladder. Stomach/Bowel: Normal stomach, without wall thickening. Normal colon. The terminal ileum is stool filled, without inflammation including on 46/3 and coronal image 41. Mid jejunal loops are dilated and fluid-filled, including at up to 3.8 cm on 42/3. The distal small bowel is decompressed. No focal transition point is identified. No evidence of active enteritis. Vascular/Lymphatic: Aortic atherosclerosis. No abdominopelvic adenopathy. Reproductive: Normal prostate. Other: Trace right pelvic fluid on 70/3. Trace fluid adjacent dilated left abdominal small bowel loops on 52/3. Musculoskeletal: Tiny periumbilical fat containing ventral wall hernia on 46/3. No active sacroiliitis. Degenerate disc disease at the lumbosacral junction. Disc bulge at L4-5. IMPRESSION: 1. Proximal to mid small bowel dilatation with decompressed distal small bowel. Findings most consistent with partial small bowel obstruction, likely due to adhesions. No evidence of active enteritis. 2. Trace abdominopelvic fluid, likely secondary. 3.  Aortic Atherosclerosis (ICD10-I70.0). Electronically Signed   By: KAbigail MiyamotoM.D.   On: 01/13/2021 08:30    Microbiology: Results for orders placed or performed during the hospital encounter of 01/13/21  Resp Panel by RT-PCR (Flu A&B, Covid) Nasopharyngeal Swab     Status: None   Collection Time: 01/13/21  6:31 AM   Specimen: Nasopharyngeal Swab; Nasopharyngeal(NP) swabs in vial transport medium   Result Value Ref Range Status   SARS Coronavirus 2 by RT PCR NEGATIVE NEGATIVE Final    Comment: (NOTE) SARS-CoV-2 target nucleic acids are NOT DETECTED.  The SARS-CoV-2 RNA is generally detectable in upper respiratory specimens during the acute phase of infection. The lowest concentration of SARS-CoV-2 viral copies this assay can detect is 138 copies/mL. A negative result does not preclude SARS-Cov-2 infection and should not be used as the sole basis for treatment or other patient management decisions. A negative result may occur with  improper specimen collection/handling, submission of specimen other than nasopharyngeal swab, presence of viral mutation(s) within the areas targeted by this assay, and inadequate number of viral copies(<138 copies/mL). A negative result must be combined with clinical observations, patient history, and epidemiological information. The expected result is Negative.  Fact Sheet for Patients:  hEntrepreneurPulse.com.au Fact Sheet for Healthcare Providers:  hIncredibleEmployment.be This test is no t yet approved or cleared by the [place] and  has been authorized for detection and/or diagnosis of SARS-CoV-2 by FDA under an Emergency Use Authorization (EUA). This EUA will remain  in effect (meaning this test can be used) for the duration of the COVID-19 declaration under Section 564(b)(1) of the Act, 21 U.S.C.section 360bbb-3(b)(1), unless the authorization is terminated  or revoked sooner.       Influenza A by PCR NEGATIVE NEGATIVE Final   Influenza B by PCR NEGATIVE NEGATIVE Final    Comment: (NOTE) The Xpert Xpress SARS-CoV-2/FLU/RSV plus assay is intended as an aid in the diagnosis of influenza from Nasopharyngeal swab specimens and should not  be used as a sole basis for treatment. Nasal washings and aspirates are unacceptable for Xpert Xpress SARS-CoV-2/FLU/RSV testing.  Fact Sheet for  Patients: EntrepreneurPulse.com.au  Fact Sheet for Healthcare Providers: IncredibleEmployment.be  This test is not yet approved or cleared by the [place] FDA and has been authorized for detection and/or diagnosis of SARS-CoV-2 by FDA under an Emergency Use Authorization (EUA). This EUA will remain in effect (meaning this test can be used) for the duration of the COVID-19 declaration under Section 564(b)(1) of the Act, 21 U.S.C. section 360bbb-3(b)(1), unless the authorization is terminated or revoked.  Performed at [facility], [place] [address] [place], [place] [address]     Labs: CBC: Recent Labs  Lab 01/13/21 0640  WBC 9.6  NEUTROABS 7.7  HGB 15.7  HCT 46.5  MCV 87.2  PLT 751   Basic Metabolic Panel: Recent Labs  Lab 01/13/21 0640  NA 136  K 4.0  CL 101  CO2 26  GLUCOSE 106*  BUN 14  CREATININE 1.08  CALCIUM 9.3   Liver Function Tests: Recent Labs  Lab 01/13/21 0640  AST 30  ALT 23  ALKPHOS 42  BILITOT 1.0  PROT 6.8  ALBUMIN 3.9   CBG: No results for input(s): GLUCAP in the last 168 hours.  Discharge time spent: less than 30 minutes.  Signed:  Karmen Bongo MD.  [facility] 01/13/2021

## 2021-01-13 NOTE — Discharge Instructions (Signed)
Follow up: Please make an appointment to see your primary physician for follow up within 7 days of hospital discharge.  At that appointment:  -We routinely change or add medications that can affect your baseline labs and fluid status; therefore, you may require repeat blood work or tests during your next visit with your PCP.  Your PCP may decide not to get them or may add new tests based on their clinical decision.  -Please get all medicines reviewed and adjusted.  -Please request that your primary physician go over all hospital tests and procedure/radiological results at the follow up.  Please get all hospital records sent to your physician by signing a hospital release before you go home.  Activity: As tolerated with fall precautions; use walker/cane & assistance as needed.  Disposition: Home    Diet:   Clear liquids, advance as tolerated to regular diet.  For all patients - If you experience worsening of your admission symptoms or develop shortness of breath, life threatening emergency, suicidal or homicidal thoughts you must seek medical attention immediately by calling 911 or calling your MD immediately.  Read complete instructions along with all the possible side effects for all the medicines you take and that have been prescribed to you. Take any new medicines after you have completely understood and accept all the possible adverse reactions/side effects.   Do not drive, operate heavy machinery, perform activities at heights, swimming or participation in water activities or provide baby sitting services if your were admitted for syncope/seizures until you have seen by Primary MD/Neurologist and advised to do so.  Do not drive when taking pain medications.    Special Instructions: If you have smoked or chewed Tobacco  in the last 2 yrs please stop smoking; also stop any regular Alcohol and/or any Recreational drug use including marijuana.  Wear Seat belts while driving.   Please note:   You were cared for by a hospitalist during your hospital stay. If you have any questions about your discharge medications or the care you received while you were in the hospital, you can call the unit and asked to speak with the hospitalist on call. Once you are discharged, your primary care physician will handle any further medical issues. Please note that NO REFILLS for any discharge medications will be authorized, as it is imperative that you return to your primary care physician (or establish a relationship with a primary care physician if you do not have one) for your aftercare needs so that they can reassess your need for medications and monitor your lab values.

## 2021-04-22 ENCOUNTER — Inpatient Hospital Stay (HOSPITAL_COMMUNITY)
Admission: EM | Admit: 2021-04-22 | Discharge: 2021-04-26 | DRG: 389 | Disposition: A | Discharge status: Home / Self Care | Payer: 59 | Attending: Internal Medicine | Admitting: Internal Medicine

## 2021-04-22 ENCOUNTER — Emergency Department (HOSPITAL_COMMUNITY): Payer: 59

## 2021-04-22 ENCOUNTER — Inpatient Hospital Stay (HOSPITAL_COMMUNITY): Payer: 59

## 2021-04-22 ENCOUNTER — Encounter (HOSPITAL_COMMUNITY): Payer: Self-pay | Admitting: Emergency Medicine

## 2021-04-22 ENCOUNTER — Other Ambulatory Visit: Payer: Self-pay

## 2021-04-22 DIAGNOSIS — D72829 Elevated white blood cell count, unspecified: Secondary | ICD-10-CM | POA: Diagnosis not present

## 2021-04-22 DIAGNOSIS — K3189 Other diseases of stomach and duodenum: Secondary | ICD-10-CM | POA: Diagnosis present

## 2021-04-22 DIAGNOSIS — Z880 Allergy status to penicillin: Secondary | ICD-10-CM | POA: Diagnosis not present

## 2021-04-22 DIAGNOSIS — E860 Dehydration: Secondary | ICD-10-CM | POA: Diagnosis present

## 2021-04-22 DIAGNOSIS — K56609 Unspecified intestinal obstruction, unspecified as to partial versus complete obstruction: Principal | ICD-10-CM

## 2021-04-22 DIAGNOSIS — Z79899 Other long term (current) drug therapy: Secondary | ICD-10-CM | POA: Diagnosis not present

## 2021-04-22 DIAGNOSIS — J019 Acute sinusitis, unspecified: Secondary | ICD-10-CM | POA: Diagnosis present

## 2021-04-22 DIAGNOSIS — R739 Hyperglycemia, unspecified: Secondary | ICD-10-CM | POA: Diagnosis not present

## 2021-04-22 DIAGNOSIS — K5090 Crohn's disease, unspecified, without complications: Secondary | ICD-10-CM | POA: Diagnosis present

## 2021-04-22 DIAGNOSIS — T380X5A Adverse effect of glucocorticoids and synthetic analogues, initial encounter: Secondary | ICD-10-CM | POA: Diagnosis not present

## 2021-04-22 DIAGNOSIS — Z20822 Contact with and (suspected) exposure to covid-19: Secondary | ICD-10-CM | POA: Diagnosis present

## 2021-04-22 DIAGNOSIS — K4090 Unilateral inguinal hernia, without obstruction or gangrene, not specified as recurrent: Secondary | ICD-10-CM | POA: Diagnosis present

## 2021-04-22 DIAGNOSIS — K56601 Complete intestinal obstruction, unspecified as to cause: Secondary | ICD-10-CM | POA: Diagnosis present

## 2021-04-22 DIAGNOSIS — Z888 Allergy status to other drugs, medicaments and biological substances status: Secondary | ICD-10-CM

## 2021-04-22 DIAGNOSIS — Y92239 Unspecified place in hospital as the place of occurrence of the external cause: Secondary | ICD-10-CM | POA: Diagnosis not present

## 2021-04-22 DIAGNOSIS — E871 Hypo-osmolality and hyponatremia: Secondary | ICD-10-CM | POA: Diagnosis present

## 2021-04-22 DIAGNOSIS — K50912 Crohn's disease, unspecified, with intestinal obstruction: Secondary | ICD-10-CM | POA: Diagnosis not present

## 2021-04-22 LAB — URINALYSIS, ROUTINE W REFLEX MICROSCOPIC
Bilirubin Urine: NEGATIVE
Glucose, UA: NEGATIVE mg/dL
Hgb urine dipstick: NEGATIVE
Ketones, ur: NEGATIVE mg/dL
Leukocytes,Ua: NEGATIVE
Nitrite: NEGATIVE
Protein, ur: NEGATIVE mg/dL
Specific Gravity, Urine: 1.016 (ref 1.005–1.030)
pH: 6 (ref 5.0–8.0)

## 2021-04-22 LAB — COMPREHENSIVE METABOLIC PANEL
ALT: 21 U/L (ref 0–44)
AST: 26 U/L (ref 15–41)
Albumin: 3.9 g/dL (ref 3.5–5.0)
Alkaline Phosphatase: 59 U/L (ref 38–126)
Anion gap: 12 (ref 5–15)
BUN: 11 mg/dL (ref 6–20)
CO2: 21 mmol/L — ABNORMAL LOW (ref 22–32)
Calcium: 9.5 mg/dL (ref 8.9–10.3)
Chloride: 96 mmol/L — ABNORMAL LOW (ref 98–111)
Creatinine, Ser: 1.11 mg/dL (ref 0.61–1.24)
GFR, Estimated: >60 mL/min (ref >60)
Glucose, Bld: 176 mg/dL — ABNORMAL HIGH (ref 70–99)
Potassium: 4.3 mmol/L (ref 3.5–5.1)
Sodium: 129 mmol/L — ABNORMAL LOW (ref 135–145)
Total Bilirubin: 1 mg/dL (ref 0.3–1.2)
Total Protein: 7 g/dL (ref 6.5–8.1)

## 2021-04-22 LAB — CBC WITH DIFFERENTIAL/PLATELET
Abs Immature Granulocytes: 0.07 10*3/uL (ref 0.00–0.07)
Basophils Absolute: 0 10*3/uL (ref 0.0–0.1)
Basophils Relative: 0 %
Eosinophils Absolute: 0 10*3/uL (ref 0.0–0.5)
Eosinophils Relative: 0 %
HCT: 47.4 % (ref 39.0–52.0)
Hemoglobin: 16.5 g/dL (ref 13.0–17.0)
Immature Granulocytes: 1 %
Lymphocytes Relative: 14 %
Lymphs Abs: 0.9 10*3/uL (ref 0.7–4.0)
MCH: 29.9 pg (ref 26.0–34.0)
MCHC: 34.8 g/dL (ref 30.0–36.0)
MCV: 85.9 fL (ref 80.0–100.0)
Monocytes Absolute: 0.1 10*3/uL (ref 0.1–1.0)
Monocytes Relative: 2 %
Neutro Abs: 5.2 10*3/uL (ref 1.7–7.7)
Neutrophils Relative %: 83 %
Platelets: 237 10*3/uL (ref 150–400)
RBC: 5.52 MIL/uL (ref 4.22–5.81)
RDW: 12.9 % (ref 11.5–15.5)
WBC: 6.2 10*3/uL (ref 4.0–10.5)
nRBC: 0 % (ref 0.0–0.2)

## 2021-04-22 LAB — RESP PANEL BY RT-PCR (FLU A&B, COVID) ARPGX2
Influenza A by PCR: NEGATIVE
Influenza B by PCR: NEGATIVE
SARS Coronavirus 2 by RT PCR: NEGATIVE

## 2021-04-22 LAB — HEMOGLOBIN A1C
Hgb A1c MFr Bld: 5 % (ref 4.8–5.6)
Mean Plasma Glucose: 96.8 mg/dL

## 2021-04-22 LAB — LIPASE, BLOOD: Lipase: 44 U/L (ref 11–51)

## 2021-04-22 LAB — HIV ANTIBODY (ROUTINE TESTING W REFLEX): HIV Screen 4th Generation wRfx: NONREACTIVE

## 2021-04-22 MED ORDER — IOHEXOL 300 MG/ML  SOLN
100.0000 mL | Freq: Once | INTRAMUSCULAR | Status: AC | PRN
  Administered 2021-04-22: 100 mL via INTRAVENOUS

## 2021-04-22 MED ORDER — HYDROMORPHONE HCL 1 MG/ML IJ SOLN
0.5000 mg | Freq: Once | INTRAMUSCULAR | Status: AC
  Administered 2021-04-22: 0.5 mg via INTRAVENOUS
  Filled 2021-04-22: qty 1

## 2021-04-22 MED ORDER — HEPARIN SODIUM (PORCINE) 5000 UNIT/ML IJ SOLN
5000.0000 [IU] | Freq: Three times a day (TID) | INTRAMUSCULAR | Status: DC
  Administered 2021-04-22 – 2021-04-26 (×13): 5000 [IU] via SUBCUTANEOUS
  Filled 2021-04-22 (×14): qty 1

## 2021-04-22 MED ORDER — PHENOL 1.4 % MT LIQD
1.0000 | OROMUCOSAL | Status: DC | PRN
  Administered 2021-04-22: 1 via OROMUCOSAL
  Filled 2021-04-22: qty 177

## 2021-04-22 MED ORDER — ONDANSETRON HCL 4 MG/2ML IJ SOLN
4.0000 mg | Freq: Four times a day (QID) | INTRAMUSCULAR | Status: DC | PRN
  Filled 2021-04-22: qty 2

## 2021-04-22 MED ORDER — SODIUM CHLORIDE 0.9 % IV SOLN: INTRAVENOUS | Status: DC

## 2021-04-22 MED ORDER — LACTATED RINGERS IV SOLN: INTRAVENOUS | Status: DC

## 2021-04-22 MED ORDER — OXYMETAZOLINE HCL 0.05 % NA SOLN
1.0000 | Freq: Two times a day (BID) | NASAL | Status: AC
  Administered 2021-04-22 – 2021-04-25 (×5): 1 via NASAL
  Filled 2021-04-22: qty 30

## 2021-04-22 MED ORDER — ONDANSETRON HCL 4 MG/2ML IJ SOLN
4.0000 mg | Freq: Once | INTRAMUSCULAR | Status: AC
  Administered 2021-04-22: 4 mg via INTRAVENOUS
  Filled 2021-04-22: qty 2

## 2021-04-22 MED ORDER — MORPHINE SULFATE (PF) 4 MG/ML IV SOLN
4.0000 mg | Freq: Once | INTRAVENOUS | Status: AC
  Administered 2021-04-22: 4 mg via INTRAVENOUS
  Filled 2021-04-22: qty 1

## 2021-04-22 MED ORDER — METHYLPREDNISOLONE SODIUM SUCC 125 MG IJ SOLR
60.0000 mg | Freq: Every day | INTRAMUSCULAR | Status: DC
  Administered 2021-04-23 – 2021-04-26 (×4): 60 mg via INTRAVENOUS
  Filled 2021-04-22 (×4): qty 2

## 2021-04-22 MED ORDER — DIPHENHYDRAMINE HCL 50 MG/ML IJ SOLN
50.0000 mg | Freq: Every evening | INTRAMUSCULAR | Status: DC | PRN
  Administered 2021-04-22 – 2021-04-25 (×4): 50 mg via INTRAVENOUS
  Filled 2021-04-22 (×4): qty 1

## 2021-04-22 MED ORDER — HYDROMORPHONE HCL 1 MG/ML IJ SOLN
1.0000 mg | INTRAMUSCULAR | Status: DC | PRN
  Administered 2021-04-24: 1 mg via INTRAVENOUS
  Filled 2021-04-22 (×2): qty 1

## 2021-04-22 MED ORDER — SALINE SPRAY 0.65 % NA SOLN
1.0000 | NASAL | Status: DC | PRN
  Administered 2021-04-22: 1 via NASAL
  Filled 2021-04-22: qty 44

## 2021-04-22 MED ORDER — SODIUM CHLORIDE 0.9 % IV SOLN
100.0000 mg | Freq: Two times a day (BID) | INTRAVENOUS | Status: AC
  Administered 2021-04-22 – 2021-04-24 (×4): 100 mg via INTRAVENOUS
  Filled 2021-04-22 (×4): qty 100

## 2021-04-22 MED ORDER — DIATRIZOATE MEGLUMINE & SODIUM 66-10 % PO SOLN
90.0000 mL | Freq: Once | ORAL | Status: AC
  Administered 2021-04-22: 90 mL via NASOGASTRIC
  Filled 2021-04-22: qty 90

## 2021-04-22 MED ORDER — HYDROMORPHONE HCL 1 MG/ML IJ SOLN
0.5000 mg | INTRAMUSCULAR | Status: DC | PRN
Start: 2021-04-22 — End: 2021-04-27
  Administered 2021-04-22 – 2021-04-23 (×2): 0.5 mg via INTRAVENOUS
  Filled 2021-04-22: qty 0.5

## 2021-04-22 MED ORDER — METHYLPREDNISOLONE SODIUM SUCC 125 MG IJ SOLR
125.0000 mg | Freq: Once | INTRAMUSCULAR | Status: AC
  Administered 2021-04-22: 125 mg via INTRAVENOUS
  Filled 2021-04-22: qty 2

## 2021-04-22 MED ORDER — LACTATED RINGERS IV BOLUS
1000.0000 mL | Freq: Once | INTRAVENOUS | Status: AC
  Administered 2021-04-22: 1000 mL via INTRAVENOUS

## 2021-04-22 NOTE — Subjective & Objective (Signed)
CC: abd pain ?HPI: ?51 year old white male with a history of Crohn's disease for 30 years presents to the ER today with abdominal pain for about 12 hours.  He has been on doxycycline for the last 4 days due to sinus infection.  He thought his nausea that started today was secondary to doxycycline but when he started having abdominal pain, he knew this was something different.  He states that his Crohn's flares are normally preceded by a bowel obstruction.  He states his Crohn's flares do not cause any diarrhea or bloody stools. ? ?Patient has not had any abdominal surgery in the past. ? ?Other than mesalamine for his Crohn's, he takes no other regular prescription medications. ? ?On arrival, temperature 98 heart rate 86 blood pressure 120/93 satting 90% on room air. ? ?Labs: ?Lipase 44 ?Sodium 129, chloride 96, bicarb 21, BUN 11, creatinine 1.1 ? ?UA negative ? ?White count 6.2, hemoglobin 16.5, platelets 237 ? ?CT abdomen pelvis demonstrated multiple distended small bowel loops in the mid pelvis with a transition point compatible with small bowel obstruction. ? ?Surgical service consulted ? ?[facility] contacted for admission. ?

## 2021-04-22 NOTE — ED Notes (Signed)
Patient's NG tube clamped for 1 hour, now unclamped at this time. Green bile returned into suction cannister at low intermittent suction. Pt rating mild discomfort to abd at this time 5/10.  ?

## 2021-04-22 NOTE — Progress Notes (Signed)
?  Progress Note ? ? ?Patient: Shawn Dougherty TWK:[id number] DOB: Apr 30, 1970 DOA: 04/22/2021     0 ?DOS: the patient was seen and examined on 04/22/2021 ?  ?Brief hospital course: ?51 y/o male with history of Crohns disease, admitted with abdominal pain. Found to have SBO. Gen surgery and GI consulted. Currently has NG tube for decompression.  ? ?Assessment and Plan: ?* SBO (small bowel obstruction) ([place]) ?Currently on NG tube decompression with fair output ?Gen Surgery following ?SBO protocol planned ?Continue bowel rest ? ? ?Dehydration ?Continue with IVF ? ?Crohn disease ([place]) ?Chronically on mesalamine, currently on hold due to npo status ?Follows with [facility], Dr. Watt Climes. GI consult requested ?Started on IV steroids on admission ? ?Acute sinusitis ?Coughing and congestion for 1 week ?Started course of doxycycline prior to admission with reported improvement in symptoms ?Will complete out doxycycline in the hospital (2 more days) ? ?Hyponatremia ?Likely related to volume depletion ?Continue on normal saline ? ? ? ? ?  ? ?Subjective: no abdominal pain at this time ? ?Physical Exam: ?Vitals:  ? 04/22/21 1100 04/22/21 1115 04/22/21 1130 04/22/21 1145  ?BP: 132/89 (!) 143/90 104/85 135/86  ?Pulse: 73 71 67 80  ?Resp:    18  ?Temp:      ?SpO2: 95% 93% 94% 95%  ?Weight:      ?Height:      ? ?General exam: Alert, awake, oriented x 3 ?Respiratory system: Clear to auscultation. Respiratory effort normal. ?Cardiovascular system:RRR. No murmurs, rubs, gallops. ?Gastrointestinal system: Abdomen is nondistended, soft and nontender. No organomegaly or masses felt. Normal bowel sounds heard. NG tube in place ?Central nervous system: Alert and oriented. No focal neurological deficits. ?Extremities: No C/C/E, +pedal pulses ?Skin: No rashes, lesions or ulcers ?Psychiatry: Judgement and insight appear normal. Mood & affect appropriate.  ? ?Data Reviewed: ? ?Reviewed chemistry, sodium and chloride noted to be low, cbc  unrevealing ? ?Family Communication: discussed with patient ? ?Disposition: ?Status is: Inpatient ?Remains inpatient appropriate because: continued ng tube decompression for sbo ? Planned Discharge Destination: Home ? ? ? ?Time spent: 35 minutes ? ?Author: ?Kathie Dike, MD ?04/22/2021 2:34 PM ? ?For on call review [email or web address].  ?

## 2021-04-22 NOTE — ED Notes (Signed)
Fluids removed from pump for transport. Restart at 125 mL/hr. per order. ?

## 2021-04-22 NOTE — ED Triage Notes (Signed)
Pt c/o abdominal pain and bloating x a few days. Pt has hx of SBO. Denies N/V ?

## 2021-04-22 NOTE — Assessment & Plan Note (Addendum)
Coughing and congestion for 1 week ?Completed course of doxycycline ?

## 2021-04-22 NOTE — Hospital Course (Signed)
51 y/o male with history of Crohns disease, admitted with abdominal pain. Found to have SBO. Gen surgery and GI consulted. Currently has NG tube for decompression.  ?

## 2021-04-22 NOTE — H&P (Signed)
?History and Physical  ? ? ?Shawn Dougherty ZOX:[id number] DOB: 03-18-1970 DOA: 04/22/2021 ? ?DOS: the patient was seen and examined on 04/22/2021 ? ?PCP: Burnard Bunting, MD  ? ?Patient coming from: Home ? ?I have personally briefly reviewed patient's old medical records in [place] ? ?CC: abd pain ?HPI: ?51 year old white male with a history of Crohn's disease for 30 years presents to the ER today with abdominal pain for about 12 hours.  He has been on doxycycline for the last 4 days due to sinus infection.  He thought his nausea that started today was secondary to doxycycline but when he started having abdominal pain, he knew this was something different.  He states that his Crohn's flares are normally preceded by a bowel obstruction.  He states his Crohn's flares do not cause any diarrhea or bloody stools. ? ?Patient has not had any abdominal surgery in the past. ? ?Other than mesalamine for his Crohn's, he takes no other regular prescription medications. ? ?On arrival, temperature 98 heart rate 86 blood pressure 120/93 satting 90% on room air. ? ?Labs: ?Lipase 44 ?Sodium 129, chloride 96, bicarb 21, BUN 11, creatinine 1.1 ? ?UA negative ? ?White count 6.2, hemoglobin 16.5, platelets 237 ? ?CT abdomen pelvis demonstrated multiple distended small bowel loops in the mid pelvis with a transition point compatible with small bowel obstruction. ? ?Surgical service consulted ? ?[facility] contacted for admission.  ? ?ED Course: CT scan shows small bowel obstruction. ? ?Review of Systems:  ?Review of Systems  ?Constitutional: Negative.   ?HENT:  Positive for sinus pain.   ?Eyes: Negative.   ?Respiratory: Negative.    ?Cardiovascular: Negative.   ?Gastrointestinal:  Positive for abdominal pain and nausea. Negative for blood in stool, constipation and diarrhea.  ?Genitourinary: Negative.   ?Musculoskeletal: Negative.   ?Skin: Negative.   ?Neurological: Negative.   ?Endo/Heme/Allergies: Negative.    ?Psychiatric/Behavioral: Negative.    ?All other systems reviewed and are negative. ? ?Past Medical History:  ?Diagnosis Date  ? Crohn's disease ([place])   ? SBO (small bowel obstruction) ([place])   ? ? ?Past Surgical History:  ?Procedure Laterality Date  ? HERNIA REPAIR    ? ? ? reports that he has never smoked. He does not have any smokeless tobacco history on file. He reports current alcohol use of about 3.0 standard drinks per week. He reports that he does not use drugs. ? ?Allergies  ?Allergen Reactions  ? Clindamycin/Lincomycin Hives  ? Penicillins Other (See Comments)  ?  Difficulty breathing, tongue swells  ? ? ?No family history on file. ? ?Prior to Admission medications   ?Medication Sig Start Date End Date Taking? Authorizing Provider  ?benzonatate (TESSALON) 200 MG capsule Take 200 mg by mouth 3 (three) times daily as needed for cough. 04/19/21  Yes [provider]  ?diphenhydrAMINE (BENADRYL) 25 MG tablet Take 25 mg by mouth every 6 (six) hours as needed for allergies.   Yes [provider]  ?doxycycline (ADOXA) 100 MG tablet Take 100 mg by mouth See admin instructions. Bid x 5 days 04/19/21  Yes [provider]  ?mesalamine (APRISO) 0.375 g 24 hr capsule Take 1.5 g by mouth daily. 01/07/21  Yes [provider]  ? ? ?Physical Exam: ?Vitals:  ? 04/22/21 0311 04/22/21 0313 04/22/21 0415 04/22/21 0532  ?BP:  (!) 128/93 (!) 113/94 (!) 124/95  ?Pulse:  86 71 65  ?Resp:  17 18 16   ?Temp:  98 ?F (36.7 ?C)    ?  SpO2:  98% 96% 96%  ?Weight: 78 kg     ?Height: 5' 7"  (1.702 m)     ? ? ?Physical Exam ?Vitals and nursing note reviewed.  ?Constitutional:   ?   General: He is not in acute distress. ?   Appearance: Normal appearance. He is normal weight. He is not ill-appearing, toxic-appearing or diaphoretic.  ?HENT:  ?   Head: Normocephalic and atraumatic.  ?   Nose: Nose normal. No rhinorrhea.  ?Eyes:  ?   General: No scleral icterus. ?Cardiovascular:  ?   Rate and Rhythm: Normal rate and  regular rhythm.  ?   Pulses: Normal pulses.  ?Pulmonary:  ?   Effort: Pulmonary effort is normal.  ?   Breath sounds: Normal breath sounds.  ?Abdominal:  ?   General: There is distension.  ?   Tenderness: There is abdominal tenderness in the left upper quadrant.  ?   Comments: Decreased BS in LUQ ?Increased in RUQ  ?Musculoskeletal:  ?   Right lower leg: No edema.  ?   Left lower leg: No edema.  ?Skin: ?   General: Skin is warm and dry.  ?   Capillary Refill: Capillary refill takes less than 2 seconds.  ?Neurological:  ?   General: No focal deficit present.  ?   Mental Status: He is alert and oriented to person, place, and time.  ?  ? ?Labs on Admission: I have personally reviewed following labs and imaging studies ? ?CBC: ?Recent Labs  ?Lab 04/22/21 ?0092  ?WBC 6.2  ?NEUTROABS 5.2  ?HGB 16.5  ?HCT 47.4  ?MCV 85.9  ?PLT 237  ? ?Basic Metabolic Panel: ?Recent Labs  ?Lab 04/22/21 ?3300  ?NA 129*  ?K 4.3  ?CL 96*  ?CO2 21*  ?GLUCOSE 176*  ?BUN 11  ?CREATININE 1.11  ?CALCIUM 9.5  ? ?GFR: ?Estimated Creatinine Clearance: 74.4 mL/min (by C-G formula based on SCr of 1.11 mg/dL). ?Liver Function Tests: ?Recent Labs  ?Lab 04/22/21 ?7622  ?AST 26  ?ALT 21  ?ALKPHOS 59  ?BILITOT 1.0  ?PROT 7.0  ?ALBUMIN 3.9  ? ?Recent Labs  ?Lab 04/22/21 ?6333  ?LIPASE 44  ? ?No results for input(s): AMMONIA in the last 168 hours. ?Coagulation Profile: ?No results for input(s): INR, PROTIME in the last 168 hours. ?Cardiac Enzymes: ?No results for input(s): CKTOTAL, CKMB, CKMBINDEX, TROPONINI, TROPONINIHS in the last 168 hours. ?BNP (last 3 results) ?No results for input(s): PROBNP in the last 8760 hours. ?HbA1C: ?No results for input(s): HGBA1C in the last 72 hours. ?CBG: ?No results for input(s): GLUCAP in the last 168 hours. ?Lipid Profile: ?No results for input(s): CHOL, HDL, LDLCALC, TRIG, CHOLHDL, LDLDIRECT in the last 72 hours. ?Thyroid Function Tests: ?No results for input(s): TSH, T4TOTAL, FREET4, T3FREE, THYROIDAB in the last 72  hours. ?Anemia Panel: ?No results for input(s): VITAMINB12, FOLATE, FERRITIN, TIBC, IRON, RETICCTPCT in the last 72 hours. ?Urine analysis: ?   ?Component Value Date/Time  ? [place] YELLOW 04/22/2021 0320  ? APPEARANCEUR CLEAR 04/22/2021 0320  ? LABSPEC 1.016 04/22/2021 0320  ? PHURINE 6.0 04/22/2021 0320  ? GLUCOSEU NEGATIVE 04/22/2021 0320  ? [place] NEGATIVE 04/22/2021 0320  ? [place] NEGATIVE 04/22/2021 0320  ? [place] NEGATIVE 04/22/2021 0320  ? PROTEINUR NEGATIVE 04/22/2021 0320  ? UROBILINOGEN 0.2 01/24/2014 0451  ? NITRITE NEGATIVE 04/22/2021 0320  ? LEUKOCYTESUR NEGATIVE 04/22/2021 0320  ? ? ?Radiological Exams on Admission: I have personally reviewed images ?CT ABDOMEN PELVIS W CONTRAST ? ?Result Date:  04/22/2021 ?CLINICAL DATA:  Severe abdominal pain, bowel obstruction suspected. EXAM: CT ABDOMEN AND PELVIS WITH CONTRAST TECHNIQUE: Multidetector CT imaging of the abdomen and pelvis was performed using the standard protocol following bolus administration of intravenous contrast. RADIATION DOSE REDUCTION: This exam was performed according to the departmental dose-optimization program which includes automated exposure control, adjustment of the mA and/or kV according to patient size and/or use of iterative reconstruction technique. CONTRAST:  169m OMNIPAQUE IOHEXOL 300 MG/ML  SOLN COMPARISON:  01/13/2021. FINDINGS: Lower chest: No acute abnormality. Hepatobiliary: No focal liver abnormality is seen. No gallstones, gallbladder wall thickening, or biliary dilatation. Pancreas: Unremarkable. No pancreatic ductal dilatation or surrounding inflammatory changes. Spleen: Normal in size without focal abnormality. Adrenals/Urinary Tract: Adrenal glands are unremarkable. Kidneys are normal, without renal calculi, focal lesion, or hydronephrosis. Bladder is unremarkable. Stomach/Bowel: The stomach is within normal limits. There are multiple dilated loops of small bowel in the abdomen measuring up to 4.3 cm in  diameter. A transition point is seen in the mid pelvis, coronal image 71 and axial image 64. The small bowel distal to this is is decompressed. No free air or pneumatosis is seen. A normal appendix is present in the rig

## 2021-04-22 NOTE — Consult Note (Signed)
? ? ? ?Glori Luis ?02/10/1970  ?[id number].   ? ?Requesting MD: Ralene Bathe, MD ?Chief Complaint/Reason for Consult: SBO, recurrent ? ?HPI:  ?Shawn Dougherty is a 51 year old male with a past medical history of Crohn's disease, diagnosed 30 years ago, SBO, and inguinal hernia repair who presented to the ED with a chief complaint of 12 hours of upper abdominal discomfort.  States yesterday around 7 PM he developed global abdominal discomfort, bloating, mild nausea. This feels similar to previous small bowel obstructions, which he states happen roughly every 2 to 5 years.  All of these have been managed nonoperatively.  His last SBO was in December and resolved in the ER in about 12 hours with steroids and bowel rest.  States his last episode of flatus was overnight and his last bowel movement was yesterday morning.  He denies fever, chills, nausea, vomiting, chest pain, shortness of breath, or sick contacts.  He was diagnosed with a sinus infection 4-5 days ago and has been taking doxycycline.  He is followed by [facility] and his last colonoscopy was about 2 years ago --states there was no signs of malignancy.  He currently takes mesalamine, no other medications for Crohn's.  He denies tobacco use or drug use.  Reports drinking 3-5 alcoholic beverages weekly.  He is scheduled to go to [place] in about 1 week. He is currently employed as an Forensic psychologist.  ? ?ROS: As above  ?Review of Systems  ?All other systems reviewed and are negative. ? ?No family history on file. ? ?Past Medical History:  ?Diagnosis Date  ? Crohn's disease ([place])   ? SBO (small bowel obstruction) ([place])   ? ? ?Past Surgical History:  ?Procedure Laterality Date  ? HERNIA REPAIR    ? ? ?Social History:  reports that he has never smoked. He does not have any smokeless tobacco history on file. He reports current alcohol use of about 3.0 standard drinks per week. He reports that he does not use drugs. ? ?Allergies:  ?Allergies  ?Allergen Reactions  ?  Clindamycin/Lincomycin Hives  ? Penicillins Other (See Comments)  ?  Difficulty breathing, tongue swells  ? ? ?(Not in a hospital admission) ? ?Physical Exam: ?Blood pressure (!) 124/95, pulse 65, temperature 98 ?F (36.7 ?C), resp. rate 16, height 5' 7"  (1.702 m), weight 78 kg, SpO2 96 %. ?General: Pleasant white male laying on hospital bed, appears stated age, NAD. ?HEENT: head -normocephalic, atraumatic; Eyes: pupils equal and round, anicteric sclerae; oral mucosa appears pink and healthy ?Neck- Trachea is midline, no thyromegaly or JVD appreciated.  ?CV- RRR, normal S1/S2, no M/R/G, no peripheral edema  ?Pulm- breathing is non-labored. CTABL, no wheezes, rhales, rhonchi. ?Abd- soft, mild distention, mild (mostly subjective) global tenderness without guarding or peritonitis, hypoactive BS, no masses, hernias, or organomegaly appreciated.  ?GU- deferred  ?MSK- UE/LE symmetrical, no cyanosis, clubbing, or edema. ?Neuro- CN II-XII grossly in tact, no paresthesias. ?Psych- Alert and Oriented x3 with appropriate affect ?Skin: warm and dry, no rashes or lesions ? ?Results for orders placed or performed during the hospital encounter of 04/22/21 (from the past 48 hour(s))  ?CBC with Differential     Status: None  ? Collection Time: 04/22/21  3:18 AM  ?Result Value Ref Range  ? WBC 6.2 4.0 - 10.5 K/uL  ? RBC 5.52 4.22 - 5.81 MIL/uL  ? Hemoglobin 16.5 13.0 - 17.0 g/dL  ? HCT 47.4 39.0 - 52.0 %  ? MCV 85.9 80.0 - 100.0 fL  ?  MCH 29.9 26.0 - 34.0 pg  ? MCHC 34.8 30.0 - 36.0 g/dL  ? RDW 12.9 11.5 - 15.5 %  ? Platelets 237 150 - 400 K/uL  ? nRBC 0.0 0.0 - 0.2 %  ? Neutrophils Relative % 83 %  ? Neutro Abs 5.2 1.7 - 7.7 K/uL  ? Lymphocytes Relative 14 %  ? Lymphs Abs 0.9 0.7 - 4.0 K/uL  ? Monocytes Relative 2 %  ? Monocytes Absolute 0.1 0.1 - 1.0 K/uL  ? Eosinophils Relative 0 %  ? Eosinophils Absolute 0.0 0.0 - 0.5 K/uL  ? Basophils Relative 0 %  ? Basophils Absolute 0.0 0.0 - 0.1 K/uL  ? Immature Granulocytes 1 %  ? Abs  Immature Granulocytes 0.07 0.00 - 0.07 K/uL  ?  Comment: Performed at [facility], [place] [address], [place], [place] [address]  ?Comprehensive metabolic panel     Status: Abnormal  ? Collection Time: 04/22/21  3:18 AM  ?Result Value Ref Range  ? Sodium 129 (L) 135 - 145 mmol/L  ? Potassium 4.3 3.5 - 5.1 mmol/L  ? Chloride 96 (L) 98 - 111 mmol/L  ? CO2 21 (L) 22 - 32 mmol/L  ? Glucose, Bld 176 (H) 70 - 99 mg/dL  ?  Comment: Glucose reference range applies only to samples taken after fasting for at least 8 hours.  ? BUN 11 6 - 20 mg/dL  ? Creatinine, Ser 1.11 0.61 - 1.24 mg/dL  ? Calcium 9.5 8.9 - 10.3 mg/dL  ? Total Protein 7.0 6.5 - 8.1 g/dL  ? Albumin 3.9 3.5 - 5.0 g/dL  ? AST 26 15 - 41 U/L  ? ALT 21 0 - 44 U/L  ? Alkaline Phosphatase 59 38 - 126 U/L  ? Total Bilirubin 1.0 0.3 - 1.2 mg/dL  ? GFR, Estimated >60 >60 mL/min  ?  Comment: (NOTE) ?Calculated using the CKD-EPI Creatinine Equation (2021) ?  ? Anion gap 12 5 - 15  ?  Comment: Performed at [facility], [place] [address] [place], [place] [address]  ?Lipase, blood     Status: None  ? Collection Time: 04/22/21  3:18 AM  ?Result Value Ref Range  ? Lipase 44 11 - 51 U/L  ?  Comment: Performed at [facility], [place] [address], [place], [place] [address]  ?Urinalysis, Routine w reflex microscopic     Status: None  ? Collection Time: 04/22/21  3:20 AM  ?Result Value Ref Range  ? Color, Urine YELLOW YELLOW  ? APPearance CLEAR CLEAR  ? Specific Gravity, Urine 1.016 1.005 - 1.030  ? pH 6.0 5.0 - 8.0  ? Glucose, UA NEGATIVE NEGATIVE mg/dL  ? Hgb urine dipstick NEGATIVE NEGATIVE  ? Bilirubin Urine NEGATIVE NEGATIVE  ? Ketones, ur NEGATIVE NEGATIVE mg/dL  ? Protein, ur NEGATIVE NEGATIVE mg/dL  ? Nitrite NEGATIVE NEGATIVE  ? Leukocytes,Ua NEGATIVE NEGATIVE  ?  Comment: Performed at [facility], [place] [address] [place], [place] [address]  ?Resp Panel by RT-PCR (Flu A&B, Covid) Nasopharyngeal Swab     Status: None  ? Collection Time: 04/22/21  6:17 AM   ? Specimen: Nasopharyngeal Swab; Nasopharyngeal(NP) swabs in vial transport medium  ?Result Value Ref Range  ? SARS Coronavirus 2 by RT PCR NEGATIVE NEGATIVE  ?  Comment: (NOTE) ?SARS-CoV-2 target nucleic acids are NOT DETECTED. ? ?The SARS-CoV-2 RNA is generally detectable in upper respiratory ?specimens during the acute phase of infection. The lowest ?concentration of  SARS-CoV-2 viral copies this assay can detect is ?138 copies/mL. A negative result does not preclude SARS-Cov-2 ?infection and should not be used as the sole basis for treatment or ?other patient management decisions. A negative result may occur with  ?improper specimen collection/handling, submission of specimen other ?than nasopharyngeal swab, presence of viral mutation(s) within the ?areas targeted by this assay, and inadequate number of viral ?copies(<138 copies/mL). A negative result must be combined with ?clinical observations, patient history, and epidemiological ?information. The expected result is Negative. ? ?Fact Sheet for Patients:  ?EntrepreneurPulse.com.au ? ?Fact Sheet for Healthcare Providers:  ?IncredibleEmployment.be ? ?This test is no t yet approved or cleared by the [place] FDA and  ?has been authorized for detection and/or diagnosis of SARS-CoV-2 by ?FDA under an Emergency Use Authorization (EUA). This EUA will remain  ?in effect (meaning this test can be used) for the duration of the ?COVID-19 declaration under Section 564(b)(1) of the Act, 21 ?U.S.C.section 360bbb-3(b)(1), unless the authorization is terminated  ?or revoked sooner.  ? ? ?  ? Influenza A by PCR NEGATIVE NEGATIVE  ? Influenza B by PCR NEGATIVE NEGATIVE  ?  Comment: (NOTE) ?The Xpert Xpress SARS-CoV-2/FLU/RSV plus assay is intended as an aid ?in the diagnosis of influenza from Nasopharyngeal swab specimens and ?should not be used as a sole basis for treatment. Nasal washings and ?aspirates are unacceptable for Xpert Xpress  SARS-CoV-2/FLU/RSV ?testing. ? ?Fact Sheet for Patients: ?EntrepreneurPulse.com.au ? ?Fact Sheet for Healthcare Providers: ?IncredibleEmployment.be ? ?This test is not yet approved or

## 2021-04-22 NOTE — Assessment & Plan Note (Signed)
Continue with IVF ?

## 2021-04-22 NOTE — ED Provider Notes (Signed)
?[facility] ?Provider Note ? ? ?CSN: [id number] ?Arrival date & time: 04/22/21  0306 ? ?  ? ?History ? ?Chief Complaint  ?Patient presents with  ? Abdominal Pain  ? ? ?Shawn Dougherty is a 51 y.o. male. ? ?The history is provided by the patient and medical records.  ?Abdominal Pain ? ?51 year old male with history of Crohn's disease, history of recurrent SBO, presenting to the ED with concern of bowel obstruction.  States he has had abdominal pain and bloating for the past several days.  States he has still had normal bowel movements and is passing some gas.  He denies any nausea or vomiting.  States symptoms currently experiencing are identical to when he had prior SBO.  He did take some leftover prednisone that he had previously without change.  He denies any fever. ? ?Home Medications ?Prior to Admission medications   ?Medication Sig Start Date End Date Taking? Authorizing Provider  ?benzonatate (TESSALON) 200 MG capsule Take 200 mg by mouth 3 (three) times daily as needed for cough. 04/19/21  Yes [provider]  ?diphenhydrAMINE (BENADRYL) 25 MG tablet Take 25 mg by mouth every 6 (six) hours as needed for allergies.   Yes [provider]  ?doxycycline (ADOXA) 100 MG tablet Take 100 mg by mouth See admin instructions. Bid x 5 days 04/19/21  Yes [provider]  ?mesalamine (APRISO) 0.375 g 24 hr capsule Take 1.5 g by mouth daily. 01/07/21  Yes [provider]  ?   ? ?Allergies    ?Clindamycin/lincomycin and Penicillins   ? ?Review of Systems   ?Review of Systems  ?Gastrointestinal:  Positive for abdominal pain.  ?All other systems reviewed and are negative. ? ?Physical Exam ?Updated Vital Signs ?BP (!) 113/94   Pulse 71   Temp 98 ?F (36.7 ?C)   Resp 18   Ht 5' 7"  (1.702 m)   Wt 78 kg   SpO2 96%   BMI 26.94 kg/m?  ? ?Physical Exam ?Vitals and nursing note reviewed.  ?Constitutional:   ?   Appearance: He is well-developed.  ?HENT:  ?    Head: Normocephalic and atraumatic.  ?Eyes:  ?   Conjunctiva/sclera: Conjunctivae normal.  ?   Pupils: Pupils are equal, round, and reactive to light.  ?Cardiovascular:  ?   Rate and Rhythm: Normal rate and regular rhythm.  ?   Heart sounds: Normal heart sounds.  ?Pulmonary:  ?   Effort: Pulmonary effort is normal.  ?   Breath sounds: Normal breath sounds.  ?Abdominal:  ?   General: Bowel sounds are normal. There is distension (mild).  ?   Palpations: Abdomen is soft.  ?   Comments: Soft, mildly distended, no rigidity, no peritoneal signs  ?Musculoskeletal:     ?   General: Normal range of motion.  ?   Cervical back: Normal range of motion.  ?Skin: ?   General: Skin is warm and dry.  ?Neurological:  ?   Mental Status: He is alert and oriented to person, place, and time.  ? ? ?ED Results / Procedures / Treatments   ?Labs ?(all labs ordered are listed, but only abnormal results are displayed) ?Labs Reviewed  ?COMPREHENSIVE METABOLIC PANEL - Abnormal; Notable for the following components:  ?    Result Value  ? Sodium 129 (*)   ? Chloride 96 (*)   ? CO2 21 (*)   ? Glucose, Bld 176 (*)   ? All other components within  normal limits  ?RESP PANEL BY RT-PCR (FLU A&B, COVID) ARPGX2  ?CBC WITH DIFFERENTIAL/PLATELET  ?LIPASE, BLOOD  ?URINALYSIS, ROUTINE W REFLEX MICROSCOPIC  ? ? ?EKG ?None ? ?Radiology ?CT ABDOMEN PELVIS W CONTRAST ? ?Result Date: 04/22/2021 ?CLINICAL DATA:  Severe abdominal pain, bowel obstruction suspected. EXAM: CT ABDOMEN AND PELVIS WITH CONTRAST TECHNIQUE: Multidetector CT imaging of the abdomen and pelvis was performed using the standard protocol following bolus administration of intravenous contrast. RADIATION DOSE REDUCTION: This exam was performed according to the departmental dose-optimization program which includes automated exposure control, adjustment of the mA and/or kV according to patient size and/or use of iterative reconstruction technique. CONTRAST:  135m OMNIPAQUE IOHEXOL 300 MG/ML  SOLN  COMPARISON:  01/13/2021. FINDINGS: Lower chest: No acute abnormality. Hepatobiliary: No focal liver abnormality is seen. No gallstones, gallbladder wall thickening, or biliary dilatation. Pancreas: Unremarkable. No pancreatic ductal dilatation or surrounding inflammatory changes. Spleen: Normal in size without focal abnormality. Adrenals/Urinary Tract: Adrenal glands are unremarkable. Kidneys are normal, without renal calculi, focal lesion, or hydronephrosis. Bladder is unremarkable. Stomach/Bowel: The stomach is within normal limits. There are multiple dilated loops of small bowel in the abdomen measuring up to 4.3 cm in diameter. A transition point is seen in the mid pelvis, coronal image 71 and axial image 64. The small bowel distal to this is is decompressed. No free air or pneumatosis is seen. A normal appendix is present in the right lower quadrant. Vascular/Lymphatic: Aortic atherosclerosis. No enlarged abdominal or pelvic lymph nodes. Reproductive: Prostate is unremarkable. Other: A small amount of free fluid is noted in the mesenteric folds and pelvis. A small fat containing inguinal hernia is present. Musculoskeletal: No acute or suspicious osseous abnormality. IMPRESSION: 1. Multiple loops of distended small bowel in the abdomen with a transition point in the mid pelvis, compatible with small-bowel obstruction. Surgical consultation is recommended. 2. Trace amount of free fluid in the mesenteric folds and pelvis. 3. Aortic atherosclerosis. Critical findings were reported to PA LQuincy Carnesat 5:07 a.m. Electronically Signed   By: LBrett FairyM.D.   On: 04/22/2021 05:08   ? ?Procedures ?Procedures  ? ? ?Medications Ordered in ED ?Medications  ?morphine (PF) 4 MG/ML injection 4 mg (has no administration in time range)  ?ondansetron (ZOFRAN) injection 4 mg (has no administration in time range)  ?iohexol (OMNIPAQUE) 300 MG/ML solution 100 mL (100 mLs Intravenous Contrast Given 04/22/21 0446)  ? ? ?ED Course/  Medical Decision Making/ A&P ?  ?                        ?Medical Decision Making ?Amount and/or Complexity of Data Reviewed ?Labs: ordered. ?Radiology: ordered. ? ?Risk ?Prescription drug management. ?Decision regarding hospitalization. ? ? ?51year old male presenting to the ED with abdominal pain.  History of Crohn's disease with recurrent SBO, feels like he may have the same.  Denies any vomiting and was able to have a BM earlier today, still passing some flatus.  He is afebrile and nontoxic, abdomen does appear mildly distended without rigidity or peritoneal signs.  Will obtain labs, CT. ? ? ?Labs reviewed-- overall reassuring, normal WBC count.  Mild hyperglycemia without DKA.  CT with findings of SBO.  Results discussed with patient.  Will place NG.  Hospitalist consulted for admission. ? ?Spoke with Dr. RMarlowe Sax- will admit for ongoing care.  Secure message sent to on call general surgery as well for AM consultation. ? ?Final Clinical Impression(s) / ED Diagnoses ?Final  diagnoses:  ?SBO (small bowel obstruction) ([place])  ? ? ?Rx / DC Orders ?ED Discharge Orders   ? ? None  ? ?  ? ? ?  ?Larene Pickett, PA-C ?04/22/21 6387 ? ?  ?Quintella Reichert, MD ?04/22/21 0715 ? ?

## 2021-04-22 NOTE — ED Provider Triage Note (Signed)
Emergency Medicine Provider Triage Evaluation Note ? ?Shawn Dougherty , a 51 y.o. male  was evaluated in triage.  Pt complains of abdominal pain.  Hx of recurrent SBO, most recent in December 2022.  States usually managed conservatively, never required surgical intervention.  Denies vomiting, still having bowel movements and passing flatus. ? ?Review of Systems  ?Positive: Abdominal pain ?Negative: fever ? ?Physical Exam  ?BP (!) 128/93 (BP Location: Right Arm)   Pulse 86   Temp 98 ?F (36.7 ?C)   Resp 17   Ht 5' 7"  (1.702 m)   Wt 78 kg   SpO2 98%   BMI 26.94 kg/m?  ? ?Gen:   Awake, no distress   ?Resp:  Normal effort  ?MSK:   Moves extremities without difficulty  ?Other:  Abdomen appears mildly distended, no rigidity or guarding ? ?Medical Decision Making  ?Medically screening exam initiated at 3:12 AM.  Appropriate orders placed.  Shawn Dougherty was informed that the remainder of the evaluation will be completed by another provider, this initial triage assessment does not replace that evaluation, and the importance of remaining in the ED until their evaluation is complete. ? ?Abdominal pain.  Hx of recurrent SBO and reports this feels similar.  Will check labs, CT. ?  ?Larene Pickett, PA-C ?04/22/21 0315 ? ?

## 2021-04-22 NOTE — Assessment & Plan Note (Addendum)
Likely related to volume depletion ?Resolved with saline infusion ?

## 2021-04-22 NOTE — Assessment & Plan Note (Addendum)
Chronically on mesalamine, currently on hold due to npo status ?GI following ?Started on IV steroids on admission ?

## 2021-04-22 NOTE — ED Notes (Signed)
Got patient a urinal patient has call bell in reach ?

## 2021-04-22 NOTE — Assessment & Plan Note (Addendum)
Currently on NG tube decompression  ?Gen Surgery following ?Reports that he is now passing gas and had 2 bowel movements earlier today ?SBO protocol being repeated, will follow up xrays ?Hopeful to have NG tube removed if xrays show improvement ?

## 2021-04-23 DIAGNOSIS — E871 Hypo-osmolality and hyponatremia: Secondary | ICD-10-CM

## 2021-04-23 DIAGNOSIS — K56609 Unspecified intestinal obstruction, unspecified as to partial versus complete obstruction: Secondary | ICD-10-CM | POA: Diagnosis not present

## 2021-04-23 DIAGNOSIS — J019 Acute sinusitis, unspecified: Secondary | ICD-10-CM

## 2021-04-23 DIAGNOSIS — K50912 Crohn's disease, unspecified, with intestinal obstruction: Secondary | ICD-10-CM

## 2021-04-23 LAB — CBC WITH DIFFERENTIAL/PLATELET
Abs Immature Granulocytes: 0.12 10*3/uL — ABNORMAL HIGH (ref 0.00–0.07)
Basophils Absolute: 0 10*3/uL (ref 0.0–0.1)
Basophils Relative: 0 %
Eosinophils Absolute: 0 10*3/uL (ref 0.0–0.5)
Eosinophils Relative: 0 %
HCT: 42.6 % (ref 39.0–52.0)
Hemoglobin: 14.7 g/dL (ref 13.0–17.0)
Immature Granulocytes: 1 %
Lymphocytes Relative: 7 %
Lymphs Abs: 1.1 10*3/uL (ref 0.7–4.0)
MCH: 29.9 pg (ref 26.0–34.0)
MCHC: 34.5 g/dL (ref 30.0–36.0)
MCV: 86.6 fL (ref 80.0–100.0)
Monocytes Absolute: 1.3 10*3/uL — ABNORMAL HIGH (ref 0.1–1.0)
Monocytes Relative: 8 %
Neutro Abs: 13.3 10*3/uL — ABNORMAL HIGH (ref 1.7–7.7)
Neutrophils Relative %: 84 %
Platelets: 220 10*3/uL (ref 150–400)
RBC: 4.92 MIL/uL (ref 4.22–5.81)
RDW: 13 % (ref 11.5–15.5)
WBC: 15.8 10*3/uL — ABNORMAL HIGH (ref 4.0–10.5)
nRBC: 0 % (ref 0.0–0.2)

## 2021-04-23 LAB — MAGNESIUM: Magnesium: 1.9 mg/dL (ref 1.7–2.4)

## 2021-04-23 LAB — COMPREHENSIVE METABOLIC PANEL
ALT: 18 U/L (ref 0–44)
AST: 19 U/L (ref 15–41)
Albumin: 3.3 g/dL — ABNORMAL LOW (ref 3.5–5.0)
Alkaline Phosphatase: 48 U/L (ref 38–126)
Anion gap: 7 (ref 5–15)
BUN: 12 mg/dL (ref 6–20)
CO2: 27 mmol/L (ref 22–32)
Calcium: 8.9 mg/dL (ref 8.9–10.3)
Chloride: 104 mmol/L (ref 98–111)
Creatinine, Ser: 1.12 mg/dL (ref 0.61–1.24)
GFR, Estimated: >60 mL/min (ref >60)
Glucose, Bld: 121 mg/dL — ABNORMAL HIGH (ref 70–99)
Potassium: 4 mmol/L (ref 3.5–5.1)
Sodium: 138 mmol/L (ref 135–145)
Total Bilirubin: 0.3 mg/dL (ref 0.3–1.2)
Total Protein: 5.8 g/dL — ABNORMAL LOW (ref 6.5–8.1)

## 2021-04-23 MED ORDER — DIPHENHYDRAMINE HCL 50 MG/ML IJ SOLN
25.0000 mg | Freq: Two times a day (BID) | INTRAMUSCULAR | Status: DC | PRN
  Administered 2021-04-23 – 2021-04-24 (×3): 25 mg via INTRAVENOUS
  Filled 2021-04-23 (×4): qty 1

## 2021-04-23 NOTE — Progress Notes (Signed)
? ?Subjective/Chief Complaint: ?Pt with no changes ?NGT in place ?No flatus ? ? ?Objective: ?Vital signs in last 24 hours: ?Temp:  [97.7 ?F (36.5 ?C)-98.6 ?F (37 ?C)] 98.6 ?F (37 ?C) (04/01 3785) ?Pulse Rate:  [59-82] 65 (04/01 0858) ?Resp:  [16-18] 18 (04/01 0858) ?BP: (104-144)/(77-98) 125/77 (04/01 8850) ?SpO2:  [91 %-95 %] 95 % (04/01 0858) ?Last BM Date : 04/21/21 ? ?Intake/Output from previous day: ?03/31 0701 - 04/01 0700 ?In: 1122 [P.O.:120; IV Piggyback:1002] ?Out: 1450 [Urine:300; Emesis/NG output:1150] ?Intake/Output this shift: ?No intake/output data recorded. ? ?PE: ? ?Constitutional: No acute distress, conversant, appears states age. ?Eyes: Anicteric sclerae, moist conjunctiva, no lid lag ?Lungs: Clear to auscultation bilaterally, normal respiratory effort ?CV: regular rate and rhythm, no murmurs, no peripheral edema, pedal pulses 2+ ?GI: Soft, no masses or hepatosplenomegaly, non-tender to palpation, hypoactive BS ?Skin: No rashes, palpation reveals normal turgor ?Psychiatric: appropriate judgment and insight, oriented to person, place, and time ? ? ?Lab Results:  ?Recent Labs  ?  04/22/21 ?2774 04/23/21 ?0104  ?WBC 6.2 15.8*  ?HGB 16.5 14.7  ?HCT 47.4 42.6  ?PLT 237 220  ? ?BMET ?Recent Labs  ?  04/22/21 ?1287 04/23/21 ?0104  ?NA 129* 138  ?K 4.3 4.0  ?CL 96* 104  ?CO2 21* 27  ?GLUCOSE 176* 121*  ?BUN 11 12  ?CREATININE 1.11 1.12  ?CALCIUM 9.5 8.9  ? ?PT/INR ?No results for input(s): LABPROT, INR in the last 72 hours. ?ABG ?No results for input(s): PHART, HCO3 in the last 72 hours. ? ?Invalid input(s): PCO2, PO2 ? ?Studies/Results: ?CT ABDOMEN PELVIS W CONTRAST ? ?Result Date: 04/22/2021 ?CLINICAL DATA:  Severe abdominal pain, bowel obstruction suspected. EXAM: CT ABDOMEN AND PELVIS WITH CONTRAST TECHNIQUE: Multidetector CT imaging of the abdomen and pelvis was performed using the standard protocol following bolus administration of intravenous contrast. RADIATION DOSE REDUCTION: This exam was  performed according to the departmental dose-optimization program which includes automated exposure control, adjustment of the mA and/or kV according to patient size and/or use of iterative reconstruction technique. CONTRAST:  161m OMNIPAQUE IOHEXOL 300 MG/ML  SOLN COMPARISON:  01/13/2021. FINDINGS: Lower chest: No acute abnormality. Hepatobiliary: No focal liver abnormality is seen. No gallstones, gallbladder wall thickening, or biliary dilatation. Pancreas: Unremarkable. No pancreatic ductal dilatation or surrounding inflammatory changes. Spleen: Normal in size without focal abnormality. Adrenals/Urinary Tract: Adrenal glands are unremarkable. Kidneys are normal, without renal calculi, focal lesion, or hydronephrosis. Bladder is unremarkable. Stomach/Bowel: The stomach is within normal limits. There are multiple dilated loops of small bowel in the abdomen measuring up to 4.3 cm in diameter. A transition point is seen in the mid pelvis, coronal image 71 and axial image 64. The small bowel distal to this is is decompressed. No free air or pneumatosis is seen. A normal appendix is present in the right lower quadrant. Vascular/Lymphatic: Aortic atherosclerosis. No enlarged abdominal or pelvic lymph nodes. Reproductive: Prostate is unremarkable. Other: A small amount of free fluid is noted in the mesenteric folds and pelvis. A small fat containing inguinal hernia is present. Musculoskeletal: No acute or suspicious osseous abnormality. IMPRESSION: 1. Multiple loops of distended small bowel in the abdomen with a transition point in the mid pelvis, compatible with small-bowel obstruction. Surgical consultation is recommended. 2. Trace amount of free fluid in the mesenteric folds and pelvis. 3. Aortic atherosclerosis. Critical findings were reported to PA LQuincy Carnesat 5:07 a.m. Electronically Signed   By: LBrett FairyM.D.   On: 04/22/2021 05:08  ? ?  DG Abd Portable 1V-Small Bowel Obstruction Protocol-initial, 8 hr  delay ? ?Result Date: 04/22/2021 ?CLINICAL DATA:  Small-bowel obstruction 8 hour delay. EXAM: PORTABLE ABDOMEN - 1 VIEW COMPARISON:  Abdominal x-ray 04/22/2021. CT abdomen and pelvis 04/22/2021. FINDINGS: Nasogastric tube tip is at the level of the gastric body, similar to the prior study. Diffusely dilated small bowel loops are again noted measuring up to 4.3 cm. Appearance is similar to the prior study. Air seen within nondilated colon and nondilated small bowel in the right abdomen, also unchanged. Osseous structures within normal limits. No suspicious calcifications. IMPRESSION: Findings compatible with mid small bowel obstruction, similar to the prior exam. Electronically Signed   By: Ronney Asters M.D.   On: 04/22/2021 19:13  ? ?DG Abd Portable 1V-Small Bowel Protocol-Position Verification ? ?Result Date: 04/22/2021 ?CLINICAL DATA:  NG tube placement. EXAM: PORTABLE ABDOMEN - 1 VIEW COMPARISON:  CT scan 04/22/2021. FINDINGS: NG tube tip is in the stomach with proximal side port well below the GE junction. Diffuse gaseous bowel distension again noted. IMPRESSION: NG tube tip is in the stomach with proximal side port well below the GE junction. Electronically Signed   By: Misty Stanley M.D.   On: 04/22/2021 09:16   ? ?Anti-infectives: ?Anti-infectives (From admission, onward)  ? ? Start     Dose/Rate Route Frequency Ordered Stop  ? 04/22/21 1300  doxycycline (VIBRAMYCIN) 100 mg in sodium chloride 0.9 % 250 mL IVPB       ? 100 mg ?125 mL/hr over 120 Minutes Intravenous Every 12 hours 04/22/21 1218 04/24/21 1259  ? ?  ? ? ?Assessment/Plan: ?This is a pleasant 51 y/o M with a PMH Crohn's disease on mesalamine who presents with recurrent SBO. ?- afebrile, hemodynamically stable, CBC unremarkable  ?- CT abd/pelvis w/ gastric distention, dilated loops of small bowel with a transition point in the pelvis, consistent with SBO. There is no free air. Trace free fluid in the pelvis.  ?- recommend attempted non-operative  management with NG decompression, SBO protocol with gastrografin, and bowel rest. No emergent surgical needs identified on exam today. ?- recommend GI consult for evaluation of Crohn's disease  ?- OOB, mobilize as tolerated ?  ?FEN - NPO, IVF, NGT to LIWS; ice chips Dougherty. Would avoid sips with meds as intestinal absorption of medications is not optimal currently.  ?VTE - SCD's, chemical VTE Dougherty from a surgical perspective ?ID - has been on doxycycline for sinus infection, IV abx for this per primary team ?  ?Below per [facility] -- ?Sinusitis - on doxy at home ?Hyponatremia - 129 ?Hyperglycemia - A1c pending ?  ? ? LOS: 1 day  ? ? ?Shawn Dougherty ?04/23/2021 ? ?

## 2021-04-23 NOTE — Consult Note (Signed)
[facility] Consultation Note ? ?Referring Provider: [facility] ?Primary Care Physician:  Burnard Bunting, MD ?Primary Gastroenterologist:  Dr. Watt Climes ? ?Reason for Consultation:  small bowel obstruction ? ?HPI: Shawn Dougherty is a 50 y.o. male admitted recurrent small bowel obstruction.  Has 30+ year history of Crohn's disease, with multiple recurrent small bowel obstructions.  On maintenance mesalamine at home.  Has had progressive nausea, vomiting, distention.  AT present, has NGT draining feculent-appearing material, has no flatus, no stool output. ? ? ?Past Medical History:  ?Diagnosis Date  ? Crohn's disease ([place])   ? SBO (small bowel obstruction) ([place])   ? ? ?Past Surgical History:  ?Procedure Laterality Date  ? HERNIA REPAIR    ? ? ?Prior to Admission medications   ?Medication Sig Start Date End Date Taking? Authorizing Provider  ?benzonatate (TESSALON) 200 MG capsule Take 200 mg by mouth 3 (three) times daily as needed for cough. 04/19/21  Yes [provider]  ?diphenhydrAMINE (BENADRYL) 25 MG tablet Take 25 mg by mouth every 6 (six) hours as needed for allergies.   Yes [provider]  ?doxycycline (ADOXA) 100 MG tablet Take 100 mg by mouth See admin instructions. Bid x 5 days 04/19/21  Yes [provider]  ?mesalamine (APRISO) 0.375 g 24 hr capsule Take 1.5 g by mouth daily. 01/07/21  Yes [provider]  ? ? ?Current Facility-Administered Medications  ?Medication Dose Route Frequency Provider Last Rate Last Admin  ? 0.9 %  sodium chloride infusion   Intravenous Continuous Kathie Dike, MD 100 mL/hr at 04/23/21 1232 New Bag at 04/23/21 1232  ? diphenhydrAMINE (BENADRYL) injection 50 mg  50 mg Intravenous QHS PRN Kathie Dike, MD   50 mg at 04/22/21 2217  ? doxycycline (VIBRAMYCIN) 100 mg in sodium chloride 0.9 % 250 mL IVPB  100 mg Intravenous Q12H Kathie Dike, MD 125 mL/hr at 04/23/21 1231 100 mg at 04/23/21 1231  ? heparin injection 5,000  Units  5,000 Units Subcutaneous Q8H Kristopher Oppenheim, DO   5,000 Units at 04/23/21 8110  ? HYDROmorphone (DILAUDID) injection 0.5 mg  0.5 mg Intravenous Q2H PRN Kristopher Oppenheim, DO   0.5 mg at 04/22/21 2235  ? Or  ? HYDROmorphone (DILAUDID) injection 1 mg  1 mg Intravenous Q2H PRN Kristopher Oppenheim, DO      ? methylPREDNISolone sodium succinate (SOLU-MEDROL) 125 mg/2 mL injection 60 mg  60 mg Intravenous Daily Kristopher Oppenheim, DO   60 mg at 04/23/21 3159  ? ondansetron (ZOFRAN) injection 4 mg  4 mg Intravenous Q6H PRN Kristopher Oppenheim, DO      ? oxymetazoline (AFRIN) 0.05 % nasal spray 1 spray  1 spray Each Nare BID Kathie Dike, MD   1 spray at 04/22/21 2233  ? phenol (CHLORASEPTIC) mouth spray 1 spray  1 spray Mouth/Throat PRN Kathie Dike, MD   1 spray at 04/22/21 1208  ? sodium chloride (OCEAN) 0.65 % nasal spray 1 spray  1 spray Each Nare PRN Kathie Dike, MD   1 spray at 04/22/21 2220  ? ? ?Allergies as of 04/22/2021 - Review Complete 04/22/2021  ?Allergen Reaction Noted  ? Clindamycin/lincomycin Hives 01/26/2013  ? Penicillins Other (See Comments) 01/26/2013  ? ? ?No family history on file. ? ?Social History  ? ?Socioeconomic History  ? Marital status: Married  ?  Spouse name: Not on file  ? Number of children: Not on file  ? Years of education: Not on file  ? Highest education level: Not on file  ?  Occupational History  ? Occupation: attorney  ?Tobacco Use  ? Smoking status: Never  ? Smokeless tobacco: Not on file  ?Substance and Sexual Activity  ? Alcohol use: Yes  ?  Alcohol/week: 3.0 standard drinks  ?  Types: 3 Standard drinks or equivalent per week  ?  Comment: a few per week  ? Drug use: No  ? Sexual activity: Yes  ?Other Topics Concern  ? Not on file  ?Social History Narrative  ? Not on file  ? ?Social Determinants of Health  ? ?Financial Resource Strain: Not on file  ?Food Insecurity: Not on file  ?Transportation Needs: Not on file  ?Physical Activity: Not on file  ?Stress: Not on file  ?Social Connections: Not on file   ?Intimate Partner Violence: Not on file  ? ? ?Review of Systems: As per HPI, all others negative ? ?Physical Exam: ?Vital signs in last 24 hours: ?Temp:  [97.7 ?F (36.5 ?C)-98.6 ?F (37 ?C)] 98.6 ?F (37 ?C) (04/01 4174) ?Pulse Rate:  [59-82] 65 (04/01 0858) ?Resp:  [16-18] 18 (04/01 0858) ?BP: (125-135)/(77-97) 125/77 (04/01 0814) ?SpO2:  [94 %-95 %] 95 % (04/01 0858) ?Last BM Date : 04/21/21 ?General:   Alert,  Well-developed, well-nourished, pleasant and cooperative in NAD ?Head:  Normocephalic and atraumatic. ?Eyes:  Sclera clear, no icterus.   Conjunctiva pink. ?Ears:  Normal auditory acuity. ?Nose: NGT in place, feculent drainage through NGT canister No deformity, discharge,  or lesions. ?Mouth:  No deformity or lesions.  Oropharynx pink & moist. ?Neck:  Supple; no masses or thyromegaly. ?Abdomen:  moderate distention with tympany and scant bowel sounds, No masses, hepatosplenomegaly or hernias noted    ?Msk:  Symmetrical without gross deformities. Normal posture. ?Pulses:  Normal pulses noted. ?Extremities:  Without clubbing or edema. ?Neurologic:  Alert and  oriented x4;  grossly normal neurologically. ?Skin:  Intact without significant lesions or rashes. ?Psych:  Alert and cooperative. Normal mood and affect. ? ? ?Lab Results: ?Recent Labs  ?  04/22/21 ?4818 04/23/21 ?0104  ?WBC 6.2 15.8*  ?HGB 16.5 14.7  ?HCT 47.4 42.6  ?PLT 237 220  ? ?BMET ?Recent Labs  ?  04/22/21 ?5631 04/23/21 ?0104  ?NA 129* 138  ?K 4.3 4.0  ?CL 96* 104  ?CO2 21* 27  ?GLUCOSE 176* 121*  ?BUN 11 12  ?CREATININE 1.11 1.12  ?CALCIUM 9.5 8.9  ? ?LFT ?Recent Labs  ?  04/23/21 ?0104  ?PROT 5.8*  ?ALBUMIN 3.3*  ?AST 19  ?ALT 18  ?ALKPHOS 48  ?BILITOT 0.3  ? ?PT/INR ?No results for input(s): LABPROT, INR in the last 72 hours. ? ?Studies/Results: ?CT ABDOMEN PELVIS W CONTRAST ? ?Result Date: 04/22/2021 ?CLINICAL DATA:  Severe abdominal pain, bowel obstruction suspected. EXAM: CT ABDOMEN AND PELVIS WITH CONTRAST TECHNIQUE: Multidetector CT  imaging of the abdomen and pelvis was performed using the standard protocol following bolus administration of intravenous contrast. RADIATION DOSE REDUCTION: This exam was performed according to the departmental dose-optimization program which includes automated exposure control, adjustment of the mA and/or kV according to patient size and/or use of iterative reconstruction technique. CONTRAST:  17m OMNIPAQUE IOHEXOL 300 MG/ML  SOLN COMPARISON:  01/13/2021. FINDINGS: Lower chest: No acute abnormality. Hepatobiliary: No focal liver abnormality is seen. No gallstones, gallbladder wall thickening, or biliary dilatation. Pancreas: Unremarkable. No pancreatic ductal dilatation or surrounding inflammatory changes. Spleen: Normal in size without focal abnormality. Adrenals/Urinary Tract: Adrenal glands are unremarkable. Kidneys are normal, without renal calculi, focal lesion, or hydronephrosis. Bladder  is unremarkable. Stomach/Bowel: The stomach is within normal limits. There are multiple dilated loops of small bowel in the abdomen measuring up to 4.3 cm in diameter. A transition point is seen in the mid pelvis, coronal image 71 and axial image 64. The small bowel distal to this is is decompressed. No free air or pneumatosis is seen. A normal appendix is present in the right lower quadrant. Vascular/Lymphatic: Aortic atherosclerosis. No enlarged abdominal or pelvic lymph nodes. Reproductive: Prostate is unremarkable. Other: A small amount of free fluid is noted in the mesenteric folds and pelvis. A small fat containing inguinal hernia is present. Musculoskeletal: No acute or suspicious osseous abnormality. IMPRESSION: 1. Multiple loops of distended small bowel in the abdomen with a transition point in the mid pelvis, compatible with small-bowel obstruction. Surgical consultation is recommended. 2. Trace amount of free fluid in the mesenteric folds and pelvis. 3. Aortic atherosclerosis. Critical findings were reported to  PA Quincy Carnes at 5:07 a.m. Electronically Signed   By: Brett Fairy M.D.   On: 04/22/2021 05:08  ? ?DG Abd Portable 1V-Small Bowel Obstruction Protocol-initial, 8 hr delay ? ?Result Date: 04/22/2021 ?CLINIC

## 2021-04-23 NOTE — Progress Notes (Signed)
?  Progress Note ? ? ?Patient: Shawn Dougherty HQI:[id number] DOB: [birth year] DOA: 04/22/2021     1 ?DOS: the patient was seen and examined on 04/23/2021 ?  ?Brief hospital course: ?51 y/o male with history of Crohns disease, admitted with abdominal pain. Found to have SBO. Gen surgery and GI consulted. Currently has NG tube for decompression.  ? ?Assessment and Plan: ?* SBO (small bowel obstruction) ([place]) ?Currently on NG tube decompression t ?Gen Surgery following ?Continue bowel rest ?Has not had return of bowel function as of yet ? ?Dehydration ?Continue with IVF ? ?Crohn disease ([place]) ?Chronically on mesalamine, currently on hold due to npo status ?GI following ?Started on IV steroids on admission ? ?Acute sinusitis ?Coughing and congestion for 1 week ?Started course of doxycycline prior to admission with reported improvement in symptoms ?Will complete out doxycycline in the hospital (2 more days) ? ?Hyponatremia ?Likely related to volume depletion ?Resolved with saline infusion ? ? ? ? ?  ? ?Subjective: no bowel movement, no flatus. Less abdominal pain ? ?Physical Exam: ?Vitals:  ? 04/23/21 0551 04/23/21 0858 04/23/21 1708 04/23/21 2029  ?BP: 126/79 125/77 130/81 126/83  ?Pulse: (!) 59 65 77 63  ?Resp: 16 18 18 19   ?Temp: 98.3 ?F (36.8 ?C) 98.6 ?F (37 ?C) 98.6 ?F (37 ?C) 98.5 ?F (36.9 ?C)  ?TempSrc: Oral Oral Oral Oral  ?SpO2: 94% 95% 96%   ?Weight:      ?Height:      ? ?General exam: Alert, awake, oriented x 3 ?Respiratory system: Clear to auscultation. Respiratory effort normal. ?Cardiovascular system:RRR. No murmurs, rubs, gallops. ?Gastrointestinal system: Abdomen is nondistended, soft and nontender. No organomegaly or masses felt. Normal bowel sounds heard. NG tube in place ?Central nervous system: Alert and oriented. No focal neurological deficits. ?Extremities: No C/C/E, +pedal pulses ?Skin: No rashes, lesions or ulcers ?Psychiatry: Judgement and insight appear normal. Mood & affect appropriate.  ? ?Data  Reviewed: ? ?Sodium improved to normal range ? ?Family Communication: no family present ? ?Disposition: ?Status is: Inpatient ?Remains inpatient appropriate because: await return of bowel function ? Planned Discharge Destination: Home ? ? ? ?Time spent: 35 minutes ? ?Author: ?Kathie Dike, MD ?04/23/2021 9:12 PM ? ?For on call review [email or web address]  ?

## 2021-04-24 ENCOUNTER — Encounter (HOSPITAL_COMMUNITY): Payer: Self-pay | Admitting: Internal Medicine

## 2021-04-24 ENCOUNTER — Inpatient Hospital Stay (HOSPITAL_COMMUNITY): Payer: 59

## 2021-04-24 DIAGNOSIS — K56609 Unspecified intestinal obstruction, unspecified as to partial versus complete obstruction: Secondary | ICD-10-CM | POA: Diagnosis not present

## 2021-04-24 DIAGNOSIS — K50912 Crohn's disease, unspecified, with intestinal obstruction: Secondary | ICD-10-CM | POA: Diagnosis not present

## 2021-04-24 DIAGNOSIS — J019 Acute sinusitis, unspecified: Secondary | ICD-10-CM | POA: Diagnosis not present

## 2021-04-24 DIAGNOSIS — D72829 Elevated white blood cell count, unspecified: Secondary | ICD-10-CM

## 2021-04-24 DIAGNOSIS — E871 Hypo-osmolality and hyponatremia: Secondary | ICD-10-CM | POA: Diagnosis not present

## 2021-04-24 NOTE — Progress Notes (Signed)
?  Progress Note ? ? ?Patient: Shawn Dougherty PQD:[id number] DOB: 11/11/1970 DOA: 04/22/2021     2 ?DOS: the patient was seen and examined on 04/24/2021 ?  ?Brief hospital course: ?51 y/o male with history of Crohns disease, admitted with abdominal pain. Found to have SBO. Gen surgery and GI consulted. Currently has NG tube for decompression.  ? ?Assessment and Plan: ?* SBO (small bowel obstruction) ([place]) ?Currently on NG tube decompression  ?Gen Surgery following ?Continue bowel rest ?Has not had return of bowel function as of yet ? ?Dehydration ?Continue with IVF ? ?Crohn disease ([place]) ?Chronically on mesalamine, currently on hold due to npo status ?GI following ?Started on IV steroids on admission ? ?Leukocytosis ?Likely related to steroids ? ?Acute sinusitis ?Coughing and congestion for 1 week ?Completed course of doxycycline ? ?Hyponatremia ?Likely related to volume depletion ?Resolved with saline infusion ? ? ? ? ?  ? ?Subjective: He is not passing any gas or having a bowel movements.  Feels more rumbling in his stomach today ? ?Physical Exam: ?Vitals:  ? 04/23/21 2029 04/24/21 0428 04/24/21 0820 04/24/21 1711  ?BP: 126/83 122/65 137/80 (!) 132/91  ?Pulse: 63 70 67 72  ?Resp: 19 16 18 18   ?Temp: 98.5 ?F (36.9 ?C) 98.1 ?F (36.7 ?C) 98.7 ?F (37.1 ?C) 98.5 ?F (36.9 ?C)  ?TempSrc: Oral  Oral Oral  ?SpO2:  94% 100% 98%  ?Weight:      ?Height:      ? ?General exam: Alert, awake, oriented x 3 ?Respiratory system: Clear to auscultation. Respiratory effort normal. ?Cardiovascular system:RRR. No murmurs, rubs, gallops. ?Gastrointestinal system: Abdomen is nondistended, soft and nontender. No organomegaly or masses felt. Normal bowel sounds heard. ?Central nervous system: Alert and oriented. No focal neurological deficits. ?Extremities: No C/C/E, +pedal pulses ?Skin: No rashes, lesions or ulcers ?Psychiatry: Judgement and insight appear normal. Mood & affect appropriate.  ? ?Data Reviewed: ? ?WBC count elevated to 15,000,  likely related to steroids.  Chemistry unrevealing.  Potassium and magnesium normal. ? ?Family Communication: No family present ? ?Disposition: ?Status is: Inpatient ?Remains inpatient appropriate because: Continued NG tube decompression for small bowel obstruction ? Planned Discharge Destination: Home ? ? ? ?Time spent: 35 minutes ? ?Author: ?Kathie Dike, MD ?04/24/2021 6:50 PM ? ?For on call review [email or web address]  ?

## 2021-04-24 NOTE — Progress Notes (Signed)
? ?Subjective/Chief Complaint: ?Patient doing well today.  Feels his abdomen is less distended. ?NG tube output approximately half from the previous 24 hours. ? ? ?Objective: ?Vital signs in last 24 hours: ?Temp:  [98.1 ?F (36.7 ?C)-98.7 ?F (37.1 ?C)] 98.7 ?F (37.1 ?C) (04/02 0820) ?Pulse Rate:  [63-77] 67 (04/02 0820) ?Resp:  [16-19] 18 (04/02 0820) ?BP: (122-137)/(65-83) 137/80 (04/02 0820) ?SpO2:  [94 %-100 %] 100 % (04/02 0820) ?Last BM Date : 04/21/21 ? ?Intake/Output from previous day: ?04/01 0701 - 04/02 0700 ?In: 0  ?Out: 1200 [Urine:600; Emesis/NG output:600] ?Intake/Output this shift: ?No intake/output data recorded. ? ?PE: ? ?Constitutional: No acute distress, conversant, appears states age. ?Eyes: Anicteric sclerae, moist conjunctiva, no lid lag ?Lungs: Clear to auscultation bilaterally, normal respiratory effort ?CV: regular rate and rhythm, no murmurs, no peripheral edema, pedal pulses 2+ ?GI: Soft, no masses or hepatosplenomegaly, non-tender to palpation ?Skin: No rashes, palpation reveals normal turgor ?Psychiatric: appropriate judgment and insight, oriented to person, place, and time ? ? ?Lab Results:  ?Recent Labs  ?  04/22/21 ?2947 04/23/21 ?0104  ?WBC 6.2 15.8*  ?HGB 16.5 14.7  ?HCT 47.4 42.6  ?PLT 237 220  ? ?BMET ?Recent Labs  ?  04/22/21 ?6546 04/23/21 ?0104  ?NA 129* 138  ?K 4.3 4.0  ?CL 96* 104  ?CO2 21* 27  ?GLUCOSE 176* 121*  ?BUN 11 12  ?CREATININE 1.11 1.12  ?CALCIUM 9.5 8.9  ? ?PT/INR ?No results for input(s): LABPROT, INR in the last 72 hours. ?ABG ?No results for input(s): PHART, HCO3 in the last 72 hours. ? ?Invalid input(s): PCO2, PO2 ? ?Studies/Results: ?DG Abd Portable 1V-Small Bowel Obstruction Protocol-initial, 8 hr delay ? ?Result Date: 04/22/2021 ?CLINICAL DATA:  Small-bowel obstruction 8 hour delay. EXAM: PORTABLE ABDOMEN - 1 VIEW COMPARISON:  Abdominal x-ray 04/22/2021. CT abdomen and pelvis 04/22/2021. FINDINGS: Nasogastric tube tip is at the level of the gastric body,  similar to the prior study. Diffusely dilated small bowel loops are again noted measuring up to 4.3 cm. Appearance is similar to the prior study. Air seen within nondilated colon and nondilated small bowel in the right abdomen, also unchanged. Osseous structures within normal limits. No suspicious calcifications. IMPRESSION: Findings compatible with mid small bowel obstruction, similar to the prior exam. Electronically Signed   By: Ronney Asters M.D.   On: 04/22/2021 19:13   ? ?Anti-infectives: ?Anti-infectives (From admission, onward)  ? ? Start     Dose/Rate Route Frequency Ordered Stop  ? 04/22/21 1300  doxycycline (VIBRAMYCIN) 100 mg in sodium chloride 0.9 % 250 mL IVPB       ? 100 mg ?125 mL/hr over 120 Minutes Intravenous Every 12 hours 04/22/21 1218 04/24/21 0234  ? ?  ? ? ?Assessment/Plan: ?This is a pleasant 51 y/o M with a PMH Crohn's disease on mesalamine who presents with recurrent SBO. ?- afebrile, hemodynamically stable, CBC unremarkable  ?- CT abd/pelvis w/ gastric distention, dilated loops of small bowel with a transition point in the pelvis, consistent with SBO. There is no free air. Trace free fluid in the pelvis.  ?- recommend attempted non-operative management with NG decompression, SBO protocol with gastrografin, and bowel rest. No emergent surgical needs identified on exam today. ?-KUB Sunday with min change ?- recommend GI consult for evaluation of Crohn's disease  ?- OOB, mobilize as tolerated ?  ?FEN - NPO, IVF, NGT to LIWS; ice chips ok. Would avoid sips with meds as intestinal absorption of medications is not optimal currently.  ?  VTE - SCD's, chemical VTE ok from a surgical perspective ?ID - has been on doxycycline for sinus infection, IV abx for this per primary team ?  ?Below per [facility] -- ?Sinusitis - on doxy at home ?Hyponatremia - 129 ?Hyperglycemia - A1c pending ? LOS: 2 days  ? ? ?Ralene Ok ?04/24/2021 ? ?

## 2021-04-24 NOTE — Assessment & Plan Note (Signed)
Likely related to steroids ?

## 2021-04-25 ENCOUNTER — Inpatient Hospital Stay (HOSPITAL_COMMUNITY): Payer: 59

## 2021-04-25 DIAGNOSIS — K50912 Crohn's disease, unspecified, with intestinal obstruction: Secondary | ICD-10-CM | POA: Diagnosis not present

## 2021-04-25 DIAGNOSIS — E871 Hypo-osmolality and hyponatremia: Secondary | ICD-10-CM | POA: Diagnosis not present

## 2021-04-25 DIAGNOSIS — J019 Acute sinusitis, unspecified: Secondary | ICD-10-CM | POA: Diagnosis not present

## 2021-04-25 DIAGNOSIS — K56609 Unspecified intestinal obstruction, unspecified as to partial versus complete obstruction: Secondary | ICD-10-CM | POA: Diagnosis not present

## 2021-04-25 LAB — BASIC METABOLIC PANEL
Anion gap: 7 (ref 5–15)
BUN: 16 mg/dL (ref 6–20)
CO2: 24 mmol/L (ref 22–32)
Calcium: 8.2 mg/dL — ABNORMAL LOW (ref 8.9–10.3)
Chloride: 108 mmol/L (ref 98–111)
Creatinine, Ser: 1.16 mg/dL (ref 0.61–1.24)
GFR, Estimated: >60 mL/min (ref >60)
Glucose, Bld: 91 mg/dL (ref 70–99)
Potassium: 4.2 mmol/L (ref 3.5–5.1)
Sodium: 139 mmol/L (ref 135–145)

## 2021-04-25 MED ORDER — DIATRIZOATE MEGLUMINE & SODIUM 66-10 % PO SOLN
90.0000 mL | Freq: Once | ORAL | Status: AC
  Administered 2021-04-25: 90 mL via NASOGASTRIC
  Filled 2021-04-25: qty 90

## 2021-04-25 NOTE — Progress Notes (Signed)
? ?Subjective/Chief Complaint: ?Patient states he feels good - much better than previous days here. Reports less distention and started having flatus yesterday. No BM. NG 1,000 cc/24h. He has been walking. ? ? ?Objective: ?Vital signs in last 24 hours: ?Temp:  [98.1 ?F (36.7 ?C)-98.5 ?F (36.9 ?C)] 98.2 ?F (36.8 ?C) (04/03 0759) ?Pulse Rate:  [54-72] 60 (04/03 0759) ?Resp:  [17-18] 18 (04/03 0759) ?BP: (119-132)/(80-95) 128/95 (04/03 0759) ?SpO2:  [95 %-99 %] 97 % (04/03 0759) ?Last BM Date : 04/21/21 ? ?Intake/Output from previous day: ?04/02 0701 - 04/03 0700 ?In: 3546.6 [P.O.:30; I.V.:3516.6] ?Out: 1300 [Urine:300; Emesis/NG output:1000] ?Intake/Output this shift: ?No intake/output data recorded. ? ?PE: ? ?Constitutional: No acute distress, conversant, appears states age. ?Eyes: Anicteric sclerae, moist conjunctiva, no lid lag ?Lungs: Clear to auscultation bilaterally, normal respiratory effort ?CV: regular rate and rhythm, no murmurs, no peripheral edema, pedal pulses 2+ ?GI: Soft, no masses or hepatosplenomegaly, non-tender to palpation ?Skin: No rashes, palpation reveals normal turgor ?Psychiatric: appropriate judgment and insight, oriented to person, place, and time ? ? ?Lab Results:  ?Recent Labs  ?  04/23/21 ?0104  ?WBC 15.8*  ?HGB 14.7  ?HCT 42.6  ?PLT 220  ? ?BMET ?Recent Labs  ?  04/23/21 ?0104 04/25/21 ?0217  ?NA 138 139  ?K 4.0 4.2  ?CL 104 108  ?CO2 27 24  ?GLUCOSE 121* 91  ?BUN 12 16  ?CREATININE 1.12 1.16  ?CALCIUM 8.9 8.2*  ? ?PT/INR ?No results for input(s): LABPROT, INR in the last 72 hours. ?ABG ?No results for input(s): PHART, HCO3 in the last 72 hours. ? ?Invalid input(s): PCO2, PO2 ? ?Studies/Results: ?DG Abd Portable 1V ? ?Result Date: 04/24/2021 ?CLINICAL DATA:  Abdominal pain, small-bowel obstruction EXAM: PORTABLE ABDOMEN - 1 VIEW COMPARISON:  04/22/2021 FINDINGS: NG tube remains within the gastric body. Persistently dilated loops of small bowel within the abdomen measuring up to 4.2 cm  in diameter, similar to the prior exam. No gross free intraperitoneal air on AP portable supine view. IMPRESSION: Small bowel obstruction, similar to the prior exam. Electronically Signed   By: Davina Poke D.O.   On: 04/24/2021 12:19   ? ?Anti-infectives: ?Anti-infectives (From admission, onward)  ? ? Start     Dose/Rate Route Frequency Ordered Stop  ? 04/22/21 1300  doxycycline (VIBRAMYCIN) 100 mg in sodium chloride 0.9 % 250 mL IVPB       ? 100 mg ?125 mL/hr over 120 Minutes Intravenous Every 12 hours 04/22/21 1218 04/24/21 0234  ? ?  ? ? ?Assessment/Plan: ?This is a pleasant 51 y/o M with a PMH Crohn's disease on mesalamine who presents with recurrent SBO. ?- afebrile, hemodynamically stable, CBC unremarkable  ?- CT abd/pelvis w/ gastric distention, dilated loops of small bowel with a transition point in the pelvis, consistent with SBO. There is no free air. Trace free fluid in the pelvis.  ?- clinically he is showing some improvement - having flatus. KUB yesterday looks like persistent obstruction. repeat SBO protocol with gastrografin. No emergent surgical needs identified on exam today. ?-  GI was consulted for Crohn's disease  ?- OOB, mobilize as tolerated ?  ?FEN - NPO, IVF, NGT to LIWS; ice chips ok. Would avoid sips with meds as intestinal absorption of medications is not optimal currently ?VTE - SCD's, chemical VTE ok from a surgical perspective ?ID - has been on doxycycline for sinus infection, IV abx for this per primary team ?  ?Below per [facility] -- ?Sinusitis  ?Hyponatremia  ?  Hyperglycemia  ? LOS: 3 days  ? ? ?[place] ?04/25/2021 ? ?

## 2021-04-25 NOTE — Progress Notes (Signed)
Patient had a few BMs this afternoon after gastrografin was given via NG tube. ?

## 2021-04-25 NOTE — Progress Notes (Signed)
?  Progress Note ? ? ?Patient: Tevion Laforge OFB:[id number] DOB: 1970/04/14 DOA: 04/22/2021     3 ?DOS: the patient was seen and examined on 04/25/2021 ?  ?Brief hospital course: ?51 y/o male with history of Crohns disease, admitted with abdominal pain. Found to have SBO. Gen surgery and GI consulted. Currently has NG tube for decompression.  ? ?Assessment and Plan: ?* SBO (small bowel obstruction) ([place]) ?Currently on NG tube decompression  ?Gen Surgery following ?Reports that he is now passing gas and had 2 bowel movements earlier today ?SBO protocol being repeated, will follow up xrays ?Hopeful to have NG tube removed if xrays show improvement ? ?Dehydration ?Continue with IVF ? ?Crohn disease ([place]) ?Chronically on mesalamine, currently on hold due to npo status ?GI following ?Started on IV steroids on admission ? ?Leukocytosis ?Likely related to steroids ? ?Acute sinusitis ?Coughing and congestion for 1 week ?Completed course of doxycycline ? ?Hyponatremia ?Likely related to volume depletion ?Resolved with saline infusion ? ? ? ? ?  ? ?Subjective: reports that he is passing gas, had 2 BMs earlier today. Abdomen is feeling better ? ?Physical Exam: ?Vitals:  ? 04/24/21 1711 04/24/21 1951 04/25/21 0342 04/25/21 0759  ?BP: (!) 132/91 125/83 119/80 (!) 128/95  ?Pulse: 72 (!) 59 (!) 54 60  ?Resp: 18 18 17 18   ?Temp: 98.5 ?F (36.9 ?C) 98.3 ?F (36.8 ?C) 98.1 ?F (36.7 ?C) 98.2 ?F (36.8 ?C)  ?TempSrc: Oral Oral Oral Oral  ?SpO2: 98% 99% 95% 97%  ?Weight:      ?Height:      ? ?General exam: Alert, awake, oriented x 3, NG tube in place and is clamped ?Respiratory system: Clear to auscultation. Respiratory effort normal. ?Cardiovascular system:RRR. No murmurs, rubs, gallops. ?Gastrointestinal system: Abdomen is nondistended, soft and nontender. No organomegaly or masses felt. Normal bowel sounds heard. ?Central nervous system: Alert and oriented. No focal neurological deficits. ?Extremities: No C/C/E, +pedal pulses ?Skin: No  rashes, lesions or ulcers ?Psychiatry: Judgement and insight appear normal. Mood & affect appropriate.  ? ?Data Reviewed: ? ?Chemistry is unrevealing ? ?Family Communication: discussed with patient ? ?Disposition: ?Status is: Inpatient ?Remains inpatient appropriate because: continued management of bowel obstruction ? Planned Discharge Destination: Home ? ? ? ?Time spent: 35 minutes ? ?Author: ?Kathie Dike, MD ?04/25/2021 2:36 PM ? ?For on call review [email or web address]  ?

## 2021-04-25 NOTE — Progress Notes (Signed)
[facility] Progress Note ? ?Shawn Dougherty 51 y.o. 1970-05-27 ? ? ?Subjective: ?Passing gas. No BMs. Denies abdominal pain. NGT in place. Nurse in room. ? ?Objective: ?Vital signs: ?Vitals:  ? 04/25/21 0342 04/25/21 0759  ?BP: 119/80 (!) 128/95  ?Pulse: (!) 54 60  ?Resp: 17 18  ?Temp: 98.1 ?F (36.7 ?C) 98.2 ?F (36.8 ?C)  ?SpO2: 95% 97%  ? ? ?Physical Exam: ?Gen: lethargic, no acute distress, thin ?HEENT: anicteric sclera ?CV: RRR ?Chest: CTA B ?Abd: soft, nontender, nondistended, +BS ?Ext: no edema ? ?Lab Results: ?Recent Labs  ?  04/23/21 ?0104 04/25/21 ?0217  ?NA 138 139  ?K 4.0 4.2  ?CL 104 108  ?CO2 27 24  ?GLUCOSE 121* 91  ?BUN 12 16  ?CREATININE 1.12 1.16  ?CALCIUM 8.9 8.2*  ?MG 1.9  --   ? ?Recent Labs  ?  04/23/21 ?0104  ?AST 19  ?ALT 18  ?ALKPHOS 48  ?BILITOT 0.3  ?PROT 5.8*  ?ALBUMIN 3.3*  ? ?Recent Labs  ?  04/23/21 ?0104  ?WBC 15.8*  ?NEUTROABS 13.3*  ?HGB 14.7  ?HCT 42.6  ?MCV 86.6  ?PLT 220  ? ? ? ? ?Assessment/Plan: ?Small bowel obstruction in the setting of Crohn's Disease - improving slowly with passing flatus and slightly decreasing NGT output. No change in obstruction on Xray. Surgery planning for repeat SBO study with gastrografin. Continue IV steroids. Bowel rest. Supportive care. ? ? ?Shawn Dougherty ?04/25/2021, 12:36 PM ? ?Questions please call [phone] Patient ID: Shawn Dougherty, male   DOB: [birth year], 51 y.o.   MRN: [id number] ? ?

## 2021-04-26 DIAGNOSIS — D72829 Elevated white blood cell count, unspecified: Secondary | ICD-10-CM | POA: Diagnosis not present

## 2021-04-26 DIAGNOSIS — K56609 Unspecified intestinal obstruction, unspecified as to partial versus complete obstruction: Secondary | ICD-10-CM | POA: Diagnosis not present

## 2021-04-26 DIAGNOSIS — K50912 Crohn's disease, unspecified, with intestinal obstruction: Secondary | ICD-10-CM | POA: Diagnosis not present

## 2021-04-26 DIAGNOSIS — E871 Hypo-osmolality and hyponatremia: Secondary | ICD-10-CM | POA: Diagnosis not present

## 2021-04-26 MED ORDER — PREDNISONE 20 MG PO TABS: 40.0000 mg | ORAL_TABLET | Freq: Every day | ORAL | 0 refills | Status: AC

## 2021-04-26 NOTE — Progress Notes (Signed)
? ?  Subjective/Chief Complaint: ?Continues to feel better. Reports flatus and 3 Bms last night. Ongoing flatus this AM. Had high OP from NT over night (>800cc). Wants to go home, he says he is not sleeping well. ? ? ?Objective: ?Vital signs in last 24 hours: ?Temp:  [98.2 ?F (36.8 ?C)-98.9 ?F (37.2 ?C)] 98.4 ?F (36.9 ?C) (04/04 6160) ?Pulse Rate:  [56-70] 56 (04/04 7371) ?Resp:  [16-17] 16 (04/04 0626) ?BP: (129-135)/(80-102) 129/80 (04/04 9485) ?SpO2:  [95 %-97 %] 95 % (04/04 4627) ?Last BM Date : 04/25/21 ? ?Intake/Output from previous day: ?04/03 0701 - 04/04 0700 ?In: 0  ?Out: 1050 [Urine:700; Emesis/NG output:350] ?Intake/Output this shift: ?No intake/output data recorded. ? ?PE: ? ?Constitutional: No acute distress, conversant, appears states age. ?Eyes: Anicteric sclerae, moist conjunctiva, no lid lag ?Lungs: Clear to auscultation bilaterally, normal respiratory effort ?CV: regular rate and rhythm, no murmurs, no peripheral edema, pedal pulses 2+ ?GI: Soft, no masses or hepatosplenomegaly, non-tender to palpation ?Skin: No rashes, palpation reveals normal turgor ?Psychiatric: appropriate judgment and insight, oriented to person, place, and time ? ? ?Lab Results:  ?No results for input(s): WBC, HGB, HCT, PLT in the last 72 hours. ? ?BMET ?Recent Labs  ?  04/25/21 ?0217  ?NA 139  ?K 4.2  ?CL 108  ?CO2 24  ?GLUCOSE 91  ?BUN 16  ?CREATININE 1.16  ?CALCIUM 8.2*  ? ?PT/INR ?No results for input(s): LABPROT, INR in the last 72 hours. ?ABG ?No results for input(s): PHART, HCO3 in the last 72 hours. ? ?Invalid input(s): PCO2, PO2 ? ?Studies/Results: ?DG Abd Portable 1V-Small Bowel Obstruction Protocol-initial, 8 hr delay ? ?Result Date: 04/25/2021 ?CLINICAL DATA:  Small bowel obstruction EXAM: PORTABLE ABDOMEN - 1 VIEW COMPARISON:  04/24/2021 FINDINGS: NG tube is in the stomach. Decreasing gaseous distention of upper small bowel loops. Oral contrast material is seen within the colon which is decompressed. No free air  or organomegaly. IMPRESSION: NG tube in the stomach. Decreasing gaseous distention of small bowel loops. Electronically Signed   By: Rolm Baptise M.D.   On: 04/25/2021 19:57   ? ?Anti-infectives: ?Anti-infectives (From admission, onward)  ? ? Start     Dose/Rate Route Frequency Ordered Stop  ? 04/22/21 1300  doxycycline (VIBRAMYCIN) 100 mg in sodium chloride 0.9 % 250 mL IVPB       ? 100 mg ?125 mL/hr over 120 Minutes Intravenous Every 12 hours 04/22/21 1218 04/24/21 0234  ? ?  ? ? ?Assessment/Plan: ?This is a pleasant 51 y/o M with a PMH Crohn's disease on mesalamine who presents with recurrent SBO. ?- afebrile, hemodynamically stable, CBC unremarkable  ?- CT abd/pelvis w/ gastric distention, dilated loops of small bowel with a transition point in the pelvis, consistent with SBO. There is no free air. Trace free fluid in the pelvis.  ?- clinically he is improving. Having BMs. KUB with contrast in his colon. Does have persistent dark, thick output from NG with 1,000cc in his cannister this AM. CLAMP NG tube. Check residuals this afternoon. Possibly D/C NG and start liquids.  ?-  GI was consulted for Crohn's disease  ?- OOB, mobilize as tolerated ?  ?FEN - NPO, CLAMP NG ?VTE - SCD's, chemical VTE ok from a surgical perspective ?ID - has been on doxycycline for sinus infection, IV abx for this per primary team ?  ?Below per [facility] -- ?Sinusitis  ?Hyponatremia  ?Hyperglycemia  ? LOS: 4 days  ? ? ?Percival ?04/26/2021 ? ?

## 2021-04-26 NOTE — Progress Notes (Signed)
Pt says he tolerated full liquid diet today and is ready to go home; discharge papers were given and explained to him.  ?

## 2021-04-26 NOTE — Progress Notes (Signed)
[facility] Progress Note ? ?Shawn Dougherty 50 y.o. 07/22/1970 ? ?CC: Small bowel obstruction in the setting of Crohn's disease ? ? ?Subjective: ?Patient states he is doing much better today.  Passing gas, having bowel movements, and his abdomen is softer than it was previously.  High output from NG tube overnight (> 800 cc). denies abdominal pain, nausea, vomiting.  States he cannot handle another day in the hospital and would like to go home. ? ?ROS : Review of Systems  ?Constitutional:  Negative for chills and fever.  ?Gastrointestinal:  Negative for abdominal pain, blood in stool, constipation, diarrhea, heartburn, melena, nausea and vomiting.   ? ? ?Objective: ?Vital signs in last 24 hours: ?Vitals:  ? 04/25/21 2125 04/26/21 0623  ?BP: (!) 134/102 129/80  ?Pulse: 70 (!) 56  ?Resp: 17 16  ?Temp: 98.2 ?F (36.8 ?C) 98.4 ?F (36.9 ?C)  ?SpO2: 97% 95%  ? ? ?Physical Exam: ? ?General:  Alert, cooperative, no distress, appears stated age  ?Head:  Normocephalic, without obvious abnormality, atraumatic  ?Eyes:  Anicteric sclera, EOM's intact  ?Lungs:   Clear to auscultation bilaterally, respirations unlabored  ?Heart:  Regular rate and rhythm, S1, S2 normal  ?Abdomen:   Soft, non-tender, bowel sounds active all four quadrants,  no masses,   ? ? ?Lab Results: ?Recent Labs  ?  04/25/21 ?0217  ?NA 139  ?K 4.2  ?CL 108  ?CO2 24  ?GLUCOSE 91  ?BUN 16  ?CREATININE 1.16  ?CALCIUM 8.2*  ? ?No results for input(s): AST, ALT, ALKPHOS, BILITOT, PROT, ALBUMIN in the last 72 hours. ?No results for input(s): WBC, NEUTROABS, HGB, HCT, MCV, PLT in the last 72 hours. ?No results for input(s): LABPROT, INR in the last 72 hours. ? ? ? ?Assessment ?Small bowel obstruction in the setting of Crohn's disease ?- CT abd/pelvis w/ gastric distention, dilated loops of small bowel with a transition point in the pelvis, consistent with SBO. There is no free air. Trace free fluid in the pelvis ?-KUB 4/3: Decreasing gaseous distention of the  small bowel loops ? ? ?Plan: ?Clinically patient is improving.  Passing flatus and having multiple BMs.  Persistent dark, thick output from NG tube with 1000cc in cannister.  Surgery plans to clamp NG tube and check later this afternoon with possible discontinuing NG tube and starting clear liquids as well as possible discharge ?Continue IV steroids ?Continue diet per surgical team, appreciate their assistance ?Continue supportive care ?[facility] will follow ? ?Shawn Dougherty Radford Pax PA-C ?04/26/2021, 9:54 AM ? ?Contact #  [phone]  ?

## 2021-04-26 NOTE — Discharge Summary (Addendum)
Physician Discharge Summary  ?Phu Record OJJ:[id number] DOB: 1970-03-27 DOA: 04/22/2021 ? ?PCP: Burnard Bunting, MD ? ?Admit date: 04/22/2021 ?Discharge date: 04/26/2021 ? ?Admitted From: home ?Disposition:  home ? ?Recommendations for Outpatient Follow-up:  ?Follow up with PCP in 1-2 weeks ?Please obtain BMP/CBC in one week ?Follow-up with GI on 4/20 as previously scheduled ? ? ? ?Discharge Condition: Stable ?CODE STATUS: Full code ?Diet recommendation: Heart healthy ? ?Brief/Interim Summary: ?51 year old male with a history of Crohn's disease, admitted with abdominal pain.  Found to have small bowel obstruction.  General surgery and GI consulted.  He was started on IV steroids.  NG tube placed for decompression.  With conservative therapy, his bowel obstruction resolved.  He started to have bowel movements and NG tube was removed.  He was tolerating liquids prior to discharge. ? ?Discharge Diagnoses:  ?Principal Problem: ?  SBO (small bowel obstruction) ([place]) ?Active Problems: ?  Dehydration ?  Crohn disease ([place]) ?  Hyponatremia ?  Acute sinusitis ?  Leukocytosis ? ?SBO (small bowel obstruction) ([place]), complete ?Treated with NG tube decompression  ?Gen Surgery following ?Patient eventually began having bowel movements ?NG tube was discontinued and he was started on full liquids which she tolerated ?  ?Dehydration ?Continue with IVF ?  ?Crohn disease ([place]) ?Chronically on mesalamine ?GI following ?Treated with IV steroids during his admission ?Continued on prednisone until follow-up with GI ?  ?Leukocytosis ?Likely related to steroids ?  ?Acute sinusitis ?Coughing and congestion for 1 week ?Completed course of doxycycline ?  ?Hyponatremia ?Likely related to volume depletion ?Resolved with saline infusion ? ?Discharge Instructions ? ?Discharge Instructions   ? ? Diet - low sodium heart healthy   Complete by: As directed ?  ? Increase activity slowly   Complete by: As directed ?  ? ?  ? ?Allergies as of 04/26/2021    ? ?   Reactions  ? Clindamycin/lincomycin Hives  ? Penicillins Other (See Comments)  ? Difficulty breathing, tongue swells  ? ?  ? ?  ?Medication List  ?  ? ?STOP taking these medications   ? ?doxycycline 100 MG tablet ?Commonly known as: ADOXA ?  ? ?  ? ?TAKE these medications   ? ?benzonatate 200 MG capsule ?Commonly known as: TESSALON ?Take 200 mg by mouth 3 (three) times daily as needed for cough. ?  ?diphenhydrAMINE 25 MG tablet ?Commonly known as: BENADRYL ?Take 25 mg by mouth every 6 (six) hours as needed for allergies. ?  ?mesalamine 0.375 g 24 hr capsule ?Commonly known as: APRISO ?Take 1.5 g by mouth daily. ?  ?predniSONE 20 MG tablet ?Commonly known as: DELTASONE ?Take 2 tablets (40 mg total) by mouth daily with breakfast for 15 days. ?  ? ?  ? ? Follow-up Information   ? ? Clarene Essex, MD Follow up on 05/12/2021.   ?Specialty: Gastroenterology ?Contact information: ?[address] ?[place] [address] ?[phone] ? ? ?  ?  ? ?  ?  ? ?  ? ?Allergies  ?Allergen Reactions  ? Clindamycin/Lincomycin Hives  ? Penicillins Other (See Comments)  ?  Difficulty breathing, tongue swells  ? ? ?Consultations: ?General surgery ?Gastroenterology ? ? ?Procedures/Studies: ?CT ABDOMEN PELVIS W CONTRAST ? ?Result Date: 04/22/2021 ?CLINICAL DATA:  Severe abdominal pain, bowel obstruction suspected. EXAM: CT ABDOMEN AND PELVIS WITH CONTRAST TECHNIQUE: Multidetector CT imaging of the abdomen and pelvis was performed using the standard protocol following bolus administration of intravenous contrast. RADIATION DOSE REDUCTION: This exam was  performed according to the departmental dose-optimization program which includes automated exposure control, adjustment of the mA and/or kV according to patient size and/or use of iterative reconstruction technique. CONTRAST:  160m OMNIPAQUE IOHEXOL 300 MG/ML  SOLN COMPARISON:  01/13/2021. FINDINGS: Lower chest: No acute abnormality. Hepatobiliary: No focal liver abnormality  is seen. No gallstones, gallbladder wall thickening, or biliary dilatation. Pancreas: Unremarkable. No pancreatic ductal dilatation or surrounding inflammatory changes. Spleen: Normal in size without focal abnormality. Adrenals/Urinary Tract: Adrenal glands are unremarkable. Kidneys are normal, without renal calculi, focal lesion, or hydronephrosis. Bladder is unremarkable. Stomach/Bowel: The stomach is within normal limits. There are multiple dilated loops of small bowel in the abdomen measuring up to 4.3 cm in diameter. A transition point is seen in the mid pelvis, coronal image 71 and axial image 64. The small bowel distal to this is is decompressed. No free air or pneumatosis is seen. A normal appendix is present in the right lower quadrant. Vascular/Lymphatic: Aortic atherosclerosis. No enlarged abdominal or pelvic lymph nodes. Reproductive: Prostate is unremarkable. Other: A small amount of free fluid is noted in the mesenteric folds and pelvis. A small fat containing inguinal hernia is present. Musculoskeletal: No acute or suspicious osseous abnormality. IMPRESSION: 1. Multiple loops of distended small bowel in the abdomen with a transition point in the mid pelvis, compatible with small-bowel obstruction. Surgical consultation is recommended. 2. Trace amount of free fluid in the mesenteric folds and pelvis. 3. Aortic atherosclerosis. Critical findings were reported to PA LQuincy Carnesat 5:07 a.m. Electronically Signed   By: LBrett FairyM.D.   On: 04/22/2021 05:08  ? ?DG Abd Portable 1V-Small Bowel Obstruction Protocol-initial, 8 hr delay ? ?Result Date: 04/25/2021 ?CLINICAL DATA:  Small bowel obstruction EXAM: PORTABLE ABDOMEN - 1 VIEW COMPARISON:  04/24/2021 FINDINGS: NG tube is in the stomach. Decreasing gaseous distention of upper small bowel loops. Oral contrast material is seen within the colon which is decompressed. No free air or organomegaly. IMPRESSION: NG tube in the stomach. Decreasing gaseous  distention of small bowel loops. Electronically Signed   By: KRolm BaptiseM.D.   On: 04/25/2021 19:57  ? ?DG Abd Portable 1V ? ?Result Date: 04/24/2021 ?CLINICAL DATA:  Abdominal pain, small-bowel obstruction EXAM: PORTABLE ABDOMEN - 1 VIEW COMPARISON:  04/22/2021 FINDINGS: NG tube remains within the gastric body. Persistently dilated loops of small bowel within the abdomen measuring up to 4.2 cm in diameter, similar to the prior exam. No gross free intraperitoneal air on AP portable supine view. IMPRESSION: Small bowel obstruction, similar to the prior exam. Electronically Signed   By: NDavina PokeD.O.   On: 04/24/2021 12:19  ? ?DG Abd Portable 1V-Small Bowel Obstruction Protocol-initial, 8 hr delay ? ?Result Date: 04/22/2021 ?CLINICAL DATA:  Small-bowel obstruction 8 hour delay. EXAM: PORTABLE ABDOMEN - 1 VIEW COMPARISON:  Abdominal x-ray 04/22/2021. CT abdomen and pelvis 04/22/2021. FINDINGS: Nasogastric tube tip is at the level of the gastric body, similar to the prior study. Diffusely dilated small bowel loops are again noted measuring up to 4.3 cm. Appearance is similar to the prior study. Air seen within nondilated colon and nondilated small bowel in the right abdomen, also unchanged. Osseous structures within normal limits. No suspicious calcifications. IMPRESSION: Findings compatible with mid small bowel obstruction, similar to the prior exam. Electronically Signed   By: ARonney AstersM.D.   On: 04/22/2021 19:13  ? ?DG Abd Portable 1V-Small Bowel Protocol-Position Verification ? ?Result Date: 04/22/2021 ?CLINICAL DATA:  NG tube placement.  EXAM: PORTABLE ABDOMEN - 1 VIEW COMPARISON:  CT scan 04/22/2021. FINDINGS: NG tube tip is in the stomach with proximal side port well below the GE junction. Diffuse gaseous bowel distension again noted. IMPRESSION: NG tube tip is in the stomach with proximal side port well below the GE junction. Electronically Signed   By: Misty Stanley M.D.   On: 04/22/2021 09:16    ? ? ? ?Subjective: ?Feels well, no complaints ? ?Discharge Exam: ?Vitals:  ? 04/25/21 2125 04/26/21 0623 04/26/21 1022 04/26/21 1519  ?BP: (!) 134/102 129/80 (!) 126/91 127/83  ?Pulse: 70 (!) 56 61 69  ?Resp: 17 1

## 2021-11-28 ENCOUNTER — Emergency Department (HOSPITAL_BASED_OUTPATIENT_CLINIC_OR_DEPARTMENT_OTHER)
Admission: EM | Admit: 2021-11-28 | Discharge: 2021-11-28 | Disposition: A | Discharge status: Home / Self Care | Payer: 59 | Attending: Emergency Medicine | Admitting: Emergency Medicine

## 2021-11-28 ENCOUNTER — Other Ambulatory Visit: Payer: Self-pay

## 2021-11-28 ENCOUNTER — Encounter (HOSPITAL_BASED_OUTPATIENT_CLINIC_OR_DEPARTMENT_OTHER): Payer: Self-pay | Admitting: Emergency Medicine

## 2021-11-28 DIAGNOSIS — Z203 Contact with and (suspected) exposure to rabies: Secondary | ICD-10-CM | POA: Insufficient documentation

## 2021-11-28 DIAGNOSIS — Z23 Encounter for immunization: Secondary | ICD-10-CM | POA: Diagnosis not present

## 2021-11-28 DIAGNOSIS — W540XXA Bitten by dog, initial encounter: Secondary | ICD-10-CM | POA: Diagnosis not present

## 2021-11-28 DIAGNOSIS — S8991XA Unspecified injury of right lower leg, initial encounter: Secondary | ICD-10-CM | POA: Diagnosis present

## 2021-11-28 DIAGNOSIS — S80811A Abrasion, right lower leg, initial encounter: Secondary | ICD-10-CM | POA: Insufficient documentation

## 2021-11-28 DIAGNOSIS — Z2914 Encounter for prophylactic rabies immune globin: Secondary | ICD-10-CM | POA: Diagnosis not present

## 2021-11-28 DIAGNOSIS — Y9302 Activity, running: Secondary | ICD-10-CM | POA: Diagnosis not present

## 2021-11-28 MED ORDER — DOXYCYCLINE HYCLATE 100 MG PO CAPS: 100.0000 mg | ORAL_CAPSULE | Freq: Two times a day (BID) | ORAL | 0 refills | Status: AC

## 2021-11-28 MED ORDER — RABIES VACCINE, PCEC IM SUSR
1.0000 mL | Freq: Once | INTRAMUSCULAR | Status: AC
  Administered 2021-11-28: 1 mL via INTRAMUSCULAR
  Filled 2021-11-28: qty 1

## 2021-11-28 MED ORDER — RABIES IMMUNE GLOBULIN 150 UNIT/ML IM INJ
20.0000 [IU]/kg | INJECTION | Freq: Once | INTRAMUSCULAR | Status: AC
  Administered 2021-11-28: 1575 [IU] via INTRAMUSCULAR
  Filled 2021-11-28: qty 2

## 2021-11-28 NOTE — ED Notes (Addendum)
Rabies vaccine was given in right deltoid, other immunoglobulin  given in right leg, 2 in left leg for a total of 10 ml of immunoglobulin.

## 2021-11-28 NOTE — ED Provider Notes (Signed)
[facility] DEPT Provider Note   CSN: [id number] Arrival date & time: 11/28/21  1112     History  Chief Complaint  Patient presents with   Animal Bite    Shawn Dougherty is a 51 y.o. male.  Who presents the ED for evaluation of dog bite to the right lower leg.  Patient states he was running on a trail yesterday when he passed the family with 3 dogs lesions.  He states that he past to them, but the third lunged and bit him right lower leg.  States that wound was mostly scratch.  Does not believe he was punctured.  He kept running and did not asked the family for information.  He states he went to his primary care provider today for evaluation and they recommended he come to the ED for rabies vaccination as he is unable to monitor the dog for symptoms.  He did get his tetanus vaccine while in primary care office.  States he got home yesterday and immediately washed the wound and applied topical antibiotic and kept it covered.  Does not have any pain to the area at this time.  No fevers or chills.   Animal Bite      Home Medications Prior to Admission medications   Medication Sig Start Date End Date Taking? Authorizing Provider  benzonatate (TESSALON) 200 MG capsule Take 200 mg by mouth 3 (three) times daily as needed for cough. 04/19/21   [provider]  diphenhydrAMINE (BENADRYL) 25 MG tablet Take 25 mg by mouth every 6 (six) hours as needed for allergies.    [provider]  mesalamine (APRISO) 0.375 g 24 hr capsule Take 1.5 g by mouth daily. 01/07/21   [provider]      Allergies    Clindamycin/lincomycin and Penicillins    Review of Systems   Review of Systems  Skin:  Positive for wound.  All other systems reviewed and are negative.   Physical Exam Updated Vital Signs BP 123/83   Pulse 62   Temp 98 F (36.7 C)   Resp 16   Ht 5' 7"  (1.702 m)   Wt 77.1 kg   SpO2 100%   BMI 26.62 kg/m  Physical Exam Vitals and  nursing note reviewed.  Constitutional:      General: He is not in acute distress.    Appearance: Normal appearance. He is normal weight. He is not ill-appearing.  HENT:     Head: Normocephalic and atraumatic.  Pulmonary:     Effort: Pulmonary effort is normal. No respiratory distress.  Abdominal:     General: Abdomen is flat.  Musculoskeletal:        General: Normal range of motion.     Cervical back: Neck supple.  Skin:    General: Skin is warm and dry.          Comments: Abrasions to the right lower leg.  No obvious puncture.  No lacerations.  No surrounding erythema or swelling.  Neurological:     Mental Status: He is alert and oriented to person, place, and time.  Psychiatric:        Mood and Affect: Mood normal.        Behavior: Behavior normal.     ED Results / Procedures / Treatments   Labs (all labs ordered are listed, but only abnormal results are displayed) Labs Reviewed - No data to display  EKG None  Radiology No results found.  Procedures Procedures  Medications Ordered in ED Medications - No data to display  ED Course/ Medical Decision Making/ A&P                           Medical Decision Making This patient presents to the ED for concern of dog bite, this involves an extensive number of treatment options, and is a complaint that carries with it a high risk of complications and morbidity.  The differential diagnosis includes cellulitis, rabies   My initial workup includes rabies vaccine and immune globulin  Additional history obtained from: Nursing notes from this visit.  Afebrile, hemodynamically stable.  Patient is a 51 year old otherwise healthy male who presents to the ED for evaluation of dog bite and to receive rabies vaccination due to unknown dog vaccination status.  Wound occurred yesterday and patient reported cleaning the wound with thorough irrigation and soap. He was seen by his PCP and referred to the ED for rabies vaccination.  He received his tetanus update at his PCP. Wound appears superficial and without signs of infection at this time. Rabies vaccine and immune globulin was given in the ED. Patient was given return schedule for subsequent doses of rabies vaccination. He was given doxycycline for dog bite prophylaxis. Stable at discharge.   At this time there does not appear to be any evidence of an acute emergency medical condition and the patient appears stable for discharge with appropriate outpatient follow up. Diagnosis was discussed with patient who verbalizes understanding of care plan and is agreeable to discharge. I have discussed return precautions with patient and  who verbalizes understanding. Patient encouraged to follow-up with their PCP within 1 week. All questions answered.  Note: Portions of this report may have been transcribed using voice recognition software. Every effort was made to ensure accuracy; however, inadvertent computerized transcription errors may still be present.          Final Clinical Impression(s) / ED Diagnoses Final diagnoses:  None    Rx / DC Orders ED Discharge Orders     None         Nehemiah Massed 11/28/21 1342    Regan Lemming, MD 11/28/21 Einar Crow

## 2021-11-28 NOTE — Discharge Instructions (Addendum)
You have been seen today for your complaint of dog bite. Your discharge medications include doxycycline. This is an antibiotic. You should take it as prescribed. You should take it for the entire duration of the prescription. Follow up with: your PCP as needed Please seek immediate medical care if you develop any of the following symptoms: You have a red streak that leads away from your wound. You have non-clear fluid or more blood coming from your wound. There is pus or a bad smell coming from your wound. You have trouble moving your injured area. You have numbness or tingling that spreads beyond your wound. At this time there does not appear to be the presence of an emergent medical condition, however there is always the potential for conditions to change. Please read and follow the below instructions.  Do not take your medicine if  develop an itchy rash, swelling in your mouth or lips, or difficulty breathing; call 911 and seek immediate emergency medical attention if this occurs.  You may review your lab tests and imaging results in their entirety on your MyChart account.  Please discuss all results of fully with your primary care provider and other specialist at your follow-up visit.  Note: Portions of this text may have been transcribed using voice recognition software. Every effort was made to ensure accuracy; however, inadvertent computerized transcription errors may still be present.

## 2021-11-28 NOTE — ED Triage Notes (Signed)
Pt via pov from home with dog bite that happened yesterday while he was running. Pt does not have any info on the dog; states site has been bleeding and his pcp sent him for rabies vax. Pt received tetanus vaccine at pcp. Pt alert & oriented, nad noted.

## 2021-12-01 ENCOUNTER — Other Ambulatory Visit: Payer: Self-pay

## 2021-12-01 ENCOUNTER — Encounter (HOSPITAL_BASED_OUTPATIENT_CLINIC_OR_DEPARTMENT_OTHER): Payer: Self-pay | Admitting: Emergency Medicine

## 2021-12-01 ENCOUNTER — Emergency Department (HOSPITAL_BASED_OUTPATIENT_CLINIC_OR_DEPARTMENT_OTHER)
Admission: EM | Admit: 2021-12-01 | Discharge: 2021-12-01 | Disposition: A | Discharge status: Home / Self Care | Payer: 59 | Attending: Emergency Medicine | Admitting: Emergency Medicine

## 2021-12-01 DIAGNOSIS — Z23 Encounter for immunization: Secondary | ICD-10-CM | POA: Insufficient documentation

## 2021-12-01 DIAGNOSIS — W540XXD Bitten by dog, subsequent encounter: Secondary | ICD-10-CM | POA: Diagnosis not present

## 2021-12-01 MED ORDER — RABIES VACCINE, PCEC IM SUSR
1.0000 mL | Freq: Once | INTRAMUSCULAR | Status: AC
  Administered 2021-12-01: 1 mL via INTRAMUSCULAR
  Filled 2021-12-01: qty 1

## 2021-12-01 NOTE — ED Provider Notes (Signed)
  [facility] EMERGENCY DEPT Provider Note   CSN: [id number] Arrival date & time: 12/01/21  3159     History  Chief Complaint  Patient presents with   Rabies Injection    Shawn Dougherty is a 51 y.o. male.  Pt is here for a rabies shot.  Pt seen here for a dog bite. No problems with wound   The history is provided by the patient. No language interpreter was used.       Home Medications Prior to Admission medications   Medication Sig Start Date End Date Taking? Authorizing Provider  benzonatate (TESSALON) 200 MG capsule Take 200 mg by mouth 3 (three) times daily as needed for cough. 04/19/21   [provider]  diphenhydrAMINE (BENADRYL) 25 MG tablet Take 25 mg by mouth every 6 (six) hours as needed for allergies.    [provider]  doxycycline (VIBRAMYCIN) 100 MG capsule Take 1 capsule (100 mg total) by mouth 2 (two) times daily for 5 days. 11/28/21 12/03/21  Schutt, Grafton Folk, PA-C  mesalamine (APRISO) 0.375 g 24 hr capsule Take 1.5 g by mouth daily. 01/07/21   [provider]      Allergies    Clindamycin/lincomycin and Penicillins    Review of Systems   Review of Systems  All other systems reviewed and are negative.   Physical Exam Updated Vital Signs BP 132/87 (BP Location: Left Arm)   Pulse 68   Temp 97.8 F (36.6 C)   Resp 16   SpO2 99%  Physical Exam Vitals reviewed.  Constitutional:      Appearance: Normal appearance.  HENT:     Head: Normocephalic.  Cardiovascular:     Rate and Rhythm: Normal rate.  Pulmonary:     Effort: Pulmonary effort is normal.  Neurological:     General: No focal deficit present.     Mental Status: He is alert.  Psychiatric:        Mood and Affect: Mood normal.     ED Results / Procedures / Treatments   Labs (all labs ordered are listed, but only abnormal results are displayed) Labs Reviewed - No data to display  EKG None  Radiology No results  found.  Procedures Procedures    Medications Ordered in ED Medications - No data to display  ED Course/ Medical Decision Making/ A&P                           Medical Decision Making Pt here for rabies injection onlty            Final Clinical Impression(s) / ED Diagnoses Final diagnoses:  None    Rx / DC Orders ED Discharge Orders     None      An After Visit Summary was printed and given to the patient.    Fransico Meadow, PA-C 12/01/21 0920    Elgie Congo, MD 12/01/21 [phone]

## 2021-12-01 NOTE — ED Triage Notes (Signed)
Pt arrives to ED to have second rabies injection.

## 2021-12-05 ENCOUNTER — Other Ambulatory Visit: Payer: Self-pay

## 2021-12-05 ENCOUNTER — Emergency Department (HOSPITAL_BASED_OUTPATIENT_CLINIC_OR_DEPARTMENT_OTHER)
Admission: EM | Admit: 2021-12-05 | Discharge: 2021-12-05 | Disposition: A | Discharge status: Home / Self Care | Payer: 59 | Attending: Emergency Medicine | Admitting: Emergency Medicine

## 2021-12-05 ENCOUNTER — Encounter (HOSPITAL_BASED_OUTPATIENT_CLINIC_OR_DEPARTMENT_OTHER): Payer: Self-pay

## 2021-12-05 DIAGNOSIS — Z23 Encounter for immunization: Secondary | ICD-10-CM | POA: Diagnosis present

## 2021-12-05 MED ORDER — RABIES VACCINE, PCEC IM SUSR
1.0000 mL | Freq: Once | INTRAMUSCULAR | Status: AC
  Administered 2021-12-05: 1 mL via INTRAMUSCULAR
  Filled 2021-12-05: qty 1

## 2021-12-05 NOTE — ED Triage Notes (Signed)
Pt here for third rabies injection.

## 2021-12-05 NOTE — ED Notes (Signed)
Patient verbalizes understanding of discharge instructions. Opportunity for questioning and answers were provided. Patient discharged from ED.  °

## 2021-12-05 NOTE — Discharge Instructions (Signed)
Today you were given your third dose of rabies vaccination.  Please return in 7 days on 12/12/2021 for fourth and final rabies vaccination.  Do not hesitate to return to emergency department if the worrisome signs and symptoms we discussed become apparent.

## 2021-12-05 NOTE — ED Provider Notes (Signed)
[facility] EMERGENCY DEPT Provider Note   CSN: [id number] Arrival date & time: 12/05/21  1125     History  Chief Complaint  Patient presents with   Rabies Vaccine    Shawn Dougherty is a 51 y.o. male.  HPI   51 year old male presents emergency department with request of third dose of rabies vaccination.  He states that he was bit by dog while running on a trail approximately 7 days ago.  Reports finishing his antibiotic regimen.  Resents for third dose of rabies vaccination.  Denies fever, chills, night sweats, chest pain, shortness of breath, headache, dizziness, lightheadedness, abdominal pain, nausea, vomiting, urinary symptoms, change in bowel habits, drainage from wound.  Past medical history significant for SBO/Crohn's disease  Home Medications Prior to Admission medications   Medication Sig Start Date End Date Taking? Authorizing Provider  benzonatate (TESSALON) 200 MG capsule Take 200 mg by mouth 3 (three) times daily as needed for cough. 04/19/21   [provider]  diphenhydrAMINE (BENADRYL) 25 MG tablet Take 25 mg by mouth every 6 (six) hours as needed for allergies.    [provider]  mesalamine (APRISO) 0.375 g 24 hr capsule Take 1.5 g by mouth daily. 01/07/21   [provider]      Allergies    Clindamycin/lincomycin and Penicillins    Review of Systems   Review of Systems  All other systems reviewed and are negative.   Physical Exam Updated Vital Signs BP 120/77 (BP Location: Right Arm)   Pulse 64   Temp 97.8 F (36.6 C)   Resp 16   Ht 5' 7"  (1.702 m)   Wt 77.1 kg   SpO2 99%   BMI 26.63 kg/m  Physical Exam Vitals and nursing note reviewed.  Constitutional:      General: He is not in acute distress.    Appearance: He is well-developed.  HENT:     Head: Normocephalic and atraumatic.  Eyes:     Conjunctiva/sclera: Conjunctivae normal.  Cardiovascular:     Rate and Rhythm: Normal rate and regular rhythm.      Heart sounds: No murmur heard. Pulmonary:     Effort: Pulmonary effort is normal. No respiratory distress.     Breath sounds: Normal breath sounds.  Abdominal:     Palpations: Abdomen is soft.     Tenderness: There is no abdominal tenderness.  Musculoskeletal:        General: No swelling.     Cervical back: Neck supple.  Skin:    General: Skin is warm and dry.     Capillary Refill: Capillary refill takes less than 2 seconds.     Comments: Scab-like area noted where dog bite was said to occur on patient's right lower extremity.  No surrounding erythema noted.  No active drainage noted.  Neurological:     Mental Status: He is alert.  Psychiatric:        Mood and Affect: Mood normal.     ED Results / Procedures / Treatments   Labs (all labs ordered are listed, but only abnormal results are displayed) Labs Reviewed - No data to display  EKG None  Radiology No results found.  Procedures Procedures    Medications Ordered in ED Medications  rabies vaccine (RABAVERT) injection 1 mL (has no administration in time range)    ED Course/ Medical Decision Making/ A&P  Medical Decision Making Risk Prescription drug management.   This patient presents to the ED for concern of rabies vaccination, this involves an extensive number of treatment options, and is a complaint that carries with it a high risk of complications and morbidity.  The differential diagnosis includes rabies vaccination, cellulitis, erysipelas, abscess formation   Co morbidities that complicate the patient evaluation  See HPI   Additional history obtained:  Additional history obtained from EMR External records from outside source obtained and reviewed including hospital records   Lab Tests:  N/a   Imaging Studies ordered:  N/a   Cardiac Monitoring: / EKG:  The patient was maintained on a cardiac monitor.  I personally viewed and interpreted the cardiac  monitored which showed an underlying rhythm of: Sinus rhythm   Consultations Obtained:  N/a   Problem List / ED Course / Critical interventions / Medication management  Rabies vaccination I ordered medication including rabies vaccination   Reevaluation of the patient after these medicines showed that the patient stayed the same I have reviewed the patients home medicines and have made adjustments as needed   Social Determinants of Health:  Denies tobacco, illicit drug use   Test / Admission - Considered:  Rabies vaccination Vitals signs within normal range and stable throughout visit. Patient presented with repeat rabies vaccination.  Patient currently asymptomatic at this time and wound healing well.  Patient administered third dose of rabies vaccination.  Told to return in 7 days for fourth and final injection. Worrisome signs and symptoms were discussed with the patient, and the patient acknowledged understanding to return to the ED if noticed. Patient was stable upon discharge.          Final Clinical Impression(s) / ED Diagnoses Final diagnoses:  Need for rabies vaccination    Rx / DC Orders ED Discharge Orders     None         Wilnette Kales, [place] 12/05/21 1220    Tegeler, Gwenyth Allegra, MD 12/05/21 1517

## 2021-12-12 ENCOUNTER — Encounter (HOSPITAL_BASED_OUTPATIENT_CLINIC_OR_DEPARTMENT_OTHER): Payer: Self-pay

## 2021-12-12 ENCOUNTER — Emergency Department (HOSPITAL_BASED_OUTPATIENT_CLINIC_OR_DEPARTMENT_OTHER)
Admission: EM | Admit: 2021-12-12 | Discharge: 2021-12-12 | Disposition: A | Discharge status: Home / Self Care | Payer: 59 | Attending: Emergency Medicine | Admitting: Emergency Medicine

## 2021-12-12 ENCOUNTER — Other Ambulatory Visit: Payer: Self-pay

## 2021-12-12 DIAGNOSIS — Z23 Encounter for immunization: Secondary | ICD-10-CM | POA: Insufficient documentation

## 2021-12-12 DIAGNOSIS — Z203 Contact with and (suspected) exposure to rabies: Secondary | ICD-10-CM | POA: Diagnosis not present

## 2021-12-12 MED ORDER — RABIES VACCINE, PCEC IM SUSR
1.0000 mL | Freq: Once | INTRAMUSCULAR | Status: AC
  Administered 2021-12-12: 1 mL via INTRAMUSCULAR
  Filled 2021-12-12: qty 1

## 2021-12-12 NOTE — ED Provider Notes (Signed)
  [facility] EMERGENCY DEPT Provider Note   CSN: [id number] Arrival date & time: 12/12/21  0846     History  Chief Complaint  Patient presents with   Rabies Injection    Shawn Dougherty is a 51 y.o. male.  HPI     Here for 4th rabies vaccine after dog bite that occurred 11/6. No fever, no concerns regarding wound. Denies other concerns.  Home Medications Prior to Admission medications   Medication Sig Start Date End Date Taking? Authorizing Provider  benzonatate (TESSALON) 200 MG capsule Take 200 mg by mouth 3 (three) times daily as needed for cough. 04/19/21   [provider]  diphenhydrAMINE (BENADRYL) 25 MG tablet Take 25 mg by mouth every 6 (six) hours as needed for allergies.    [provider]  mesalamine (APRISO) 0.375 g 24 hr capsule Take 1.5 g by mouth daily. 01/07/21   [provider]      Allergies    Clindamycin/lincomycin and Penicillins    Review of Systems   Review of Systems  Physical Exam Updated Vital Signs BP 119/84 (BP Location: Left Arm)   Pulse 65   Temp 98 F (36.7 C) (Oral)   Resp 19   Ht 5' 7"  (1.702 m)   Wt 77.1 kg   SpO2 98%   BMI 26.62 kg/m  Physical Exam Constitutional:      General: He is not in acute distress.    Appearance: He is not ill-appearing, toxic-appearing or diaphoretic.  Skin:    Findings: No erythema (healed wound to RLE, no erythema).     ED Results / Procedures / Treatments   Labs (all labs ordered are listed, but only abnormal results are displayed) Labs Reviewed - No data to display  EKG None  Radiology No results found.  Procedures Procedures    Medications Ordered in ED Medications  rabies vaccine (RABAVERT) injection 1 mL (has no administration in time range)    ED Course/ Medical Decision Making/ A&P                           Medical Decision Making Risk Prescription drug management.   Here for 4th rabies vaccine after dog bite that occurred  11/6. No fever, no concerns regarding wound. Denies other concerns.  Given 4th dose of rabies vaccine without complication.  Patient discharged in stable condition with understanding of reasons to return.         Final Clinical Impression(s) / ED Diagnoses Final diagnoses:  Need for rabies vaccination    Rx / DC Orders ED Discharge Orders     None         Gareth Morgan, MD 12/12/21 2132

## 2021-12-12 NOTE — ED Triage Notes (Signed)
Pt is here for final rabies vaccination. Was given initial vaccine in Right, second one given in left arm.

## 2022-01-04 ENCOUNTER — Other Ambulatory Visit: Payer: Self-pay

## 2022-01-04 ENCOUNTER — Emergency Department (HOSPITAL_COMMUNITY): Payer: 59

## 2022-01-04 ENCOUNTER — Inpatient Hospital Stay (HOSPITAL_COMMUNITY)
Admission: EM | Admit: 2022-01-04 | Discharge: 2022-01-06 | DRG: 386 | Disposition: A | Discharge status: Home / Self Care | Payer: 59 | Attending: Family Medicine | Admitting: Family Medicine

## 2022-01-04 ENCOUNTER — Inpatient Hospital Stay (HOSPITAL_COMMUNITY): Payer: 59

## 2022-01-04 ENCOUNTER — Encounter (HOSPITAL_COMMUNITY): Payer: Self-pay | Admitting: Internal Medicine

## 2022-01-04 DIAGNOSIS — K56699 Other intestinal obstruction unspecified as to partial versus complete obstruction: Secondary | ICD-10-CM | POA: Diagnosis present

## 2022-01-04 DIAGNOSIS — K50012 Crohn's disease of small intestine with intestinal obstruction: Secondary | ICD-10-CM | POA: Diagnosis present

## 2022-01-04 DIAGNOSIS — K56609 Unspecified intestinal obstruction, unspecified as to partial versus complete obstruction: Secondary | ICD-10-CM | POA: Diagnosis present

## 2022-01-04 DIAGNOSIS — K5000 Crohn's disease of small intestine without complications: Secondary | ICD-10-CM | POA: Diagnosis present

## 2022-01-04 DIAGNOSIS — Z881 Allergy status to other antibiotic agents status: Secondary | ICD-10-CM

## 2022-01-04 DIAGNOSIS — Z79899 Other long term (current) drug therapy: Secondary | ICD-10-CM

## 2022-01-04 DIAGNOSIS — Z880 Allergy status to penicillin: Secondary | ICD-10-CM

## 2022-01-04 LAB — COMPREHENSIVE METABOLIC PANEL
ALT: 22 U/L (ref 0–44)
AST: 27 U/L (ref 15–41)
Albumin: 4.3 g/dL (ref 3.5–5.0)
Alkaline Phosphatase: 48 U/L (ref 38–126)
Anion gap: 10 (ref 5–15)
BUN: 9 mg/dL (ref 6–20)
CO2: 24 mmol/L (ref 22–32)
Calcium: 9.5 mg/dL (ref 8.9–10.3)
Chloride: 103 mmol/L (ref 98–111)
Creatinine, Ser: 1.15 mg/dL (ref 0.61–1.24)
GFR, Estimated: >60 mL/min (ref >60)
Glucose, Bld: 140 mg/dL — ABNORMAL HIGH (ref 70–99)
Potassium: 4.1 mmol/L (ref 3.5–5.1)
Sodium: 137 mmol/L (ref 135–145)
Total Bilirubin: 0.7 mg/dL (ref 0.3–1.2)
Total Protein: 7 g/dL (ref 6.5–8.1)

## 2022-01-04 LAB — CBC WITH DIFFERENTIAL/PLATELET
Abs Immature Granulocytes: 0.04 10*3/uL (ref 0.00–0.07)
Basophils Absolute: 0 10*3/uL (ref 0.0–0.1)
Basophils Relative: 0 %
Eosinophils Absolute: 0 10*3/uL (ref 0.0–0.5)
Eosinophils Relative: 0 %
HCT: 46.3 % (ref 39.0–52.0)
Hemoglobin: 15.6 g/dL (ref 13.0–17.0)
Immature Granulocytes: 1 %
Lymphocytes Relative: 9 %
Lymphs Abs: 0.7 10*3/uL (ref 0.7–4.0)
MCH: 29 pg (ref 26.0–34.0)
MCHC: 33.7 g/dL (ref 30.0–36.0)
MCV: 86.1 fL (ref 80.0–100.0)
Monocytes Absolute: 0.1 10*3/uL (ref 0.1–1.0)
Monocytes Relative: 2 %
Neutro Abs: 6.5 10*3/uL (ref 1.7–7.7)
Neutrophils Relative %: 88 %
Platelets: 252 10*3/uL (ref 150–400)
RBC: 5.38 MIL/uL (ref 4.22–5.81)
RDW: 13.5 % (ref 11.5–15.5)
WBC: 7.3 10*3/uL (ref 4.0–10.5)
nRBC: 0 % (ref 0.0–0.2)

## 2022-01-04 LAB — URINALYSIS, ROUTINE W REFLEX MICROSCOPIC
Bilirubin Urine: NEGATIVE
Glucose, UA: NEGATIVE mg/dL
Hgb urine dipstick: NEGATIVE
Ketones, ur: NEGATIVE mg/dL
Leukocytes,Ua: NEGATIVE
Nitrite: NEGATIVE
Protein, ur: NEGATIVE mg/dL
Specific Gravity, Urine: 1.016 (ref 1.005–1.030)
pH: 5 (ref 5.0–8.0)

## 2022-01-04 LAB — LIPASE, BLOOD: Lipase: 46 U/L (ref 11–51)

## 2022-01-04 MED ORDER — HYDROCODONE-ACETAMINOPHEN 5-325 MG PO TABS
1.0000 | ORAL_TABLET | Freq: Once | ORAL | Status: AC
  Administered 2022-01-04: 1 via ORAL
  Filled 2022-01-04: qty 1

## 2022-01-04 MED ORDER — MORPHINE SULFATE (PF) 2 MG/ML IV SOLN
2.0000 mg | INTRAVENOUS | Status: DC | PRN
  Administered 2022-01-04: 2 mg via INTRAVENOUS
  Filled 2022-01-04: qty 1

## 2022-01-04 MED ORDER — PANTOPRAZOLE SODIUM 40 MG IV SOLR
40.0000 mg | INTRAVENOUS | Status: DC
  Administered 2022-01-04 – 2022-01-05 (×2): 40 mg via INTRAVENOUS
  Filled 2022-01-04 (×2): qty 10

## 2022-01-04 MED ORDER — MORPHINE SULFATE (PF) 2 MG/ML IV SOLN
2.0000 mg | Freq: Once | INTRAVENOUS | Status: AC
  Administered 2022-01-04: 2 mg via INTRAVENOUS
  Filled 2022-01-04: qty 1

## 2022-01-04 MED ORDER — ONDANSETRON 4 MG PO TBDP
8.0000 mg | ORAL_TABLET | Freq: Once | ORAL | Status: AC
  Administered 2022-01-04: 8 mg via ORAL
  Filled 2022-01-04: qty 2

## 2022-01-04 MED ORDER — ENOXAPARIN SODIUM 40 MG/0.4ML IJ SOSY
40.0000 mg | PREFILLED_SYRINGE | INTRAMUSCULAR | Status: DC
  Administered 2022-01-04 – 2022-01-05 (×2): 40 mg via SUBCUTANEOUS
  Filled 2022-01-04 (×2): qty 0.4

## 2022-01-04 MED ORDER — SODIUM CHLORIDE 0.9% FLUSH
3.0000 mL | Freq: Two times a day (BID) | INTRAVENOUS | Status: DC
  Administered 2022-01-04 – 2022-01-05 (×2): 3 mL via INTRAVENOUS

## 2022-01-04 MED ORDER — LACTATED RINGERS IV SOLN: INTRAVENOUS | Status: DC

## 2022-01-04 MED ORDER — METHYLPREDNISOLONE SODIUM SUCC 125 MG IJ SOLR
60.0000 mg | Freq: Every day | INTRAMUSCULAR | Status: DC
  Administered 2022-01-04 – 2022-01-06 (×3): 60 mg via INTRAVENOUS
  Filled 2022-01-04 (×3): qty 2

## 2022-01-04 MED ORDER — ONDANSETRON HCL 4 MG/2ML IJ SOLN: 4.0000 mg | Freq: Four times a day (QID) | INTRAMUSCULAR | Status: DC | PRN

## 2022-01-04 MED ORDER — ACETAMINOPHEN 650 MG RE SUPP: 650.0000 mg | Freq: Four times a day (QID) | RECTAL | Status: DC | PRN

## 2022-01-04 MED ORDER — ALBUTEROL SULFATE (2.5 MG/3ML) 0.083% IN NEBU: 2.5000 mg | INHALATION_SOLUTION | Freq: Four times a day (QID) | RESPIRATORY_TRACT | Status: DC | PRN

## 2022-01-04 MED ORDER — ONDANSETRON HCL 4 MG PO TABS: 4.0000 mg | ORAL_TABLET | Freq: Four times a day (QID) | ORAL | Status: DC | PRN

## 2022-01-04 MED ORDER — IOHEXOL 350 MG/ML SOLN
75.0000 mL | Freq: Once | INTRAVENOUS | Status: AC | PRN
  Administered 2022-01-04: 75 mL via INTRAVENOUS

## 2022-01-04 MED ORDER — LACTATED RINGERS IV BOLUS
1000.0000 mL | Freq: Once | INTRAVENOUS | Status: AC
Start: 2022-01-04 — End: 2022-01-05
  Administered 2022-01-04: 1000 mL via INTRAVENOUS

## 2022-01-04 MED ORDER — MORPHINE SULFATE (PF) 2 MG/ML IV SOLN: 2.0000 mg | INTRAVENOUS | Status: DC | PRN

## 2022-01-04 MED ORDER — ACETAMINOPHEN 325 MG PO TABS: 650.0000 mg | ORAL_TABLET | Freq: Four times a day (QID) | ORAL | Status: DC | PRN

## 2022-01-04 MED ORDER — HYDROMORPHONE HCL 1 MG/ML IJ SOLN: 0.5000 mg | Freq: Once | INTRAMUSCULAR | Status: DC

## 2022-01-04 NOTE — ED Provider Triage Note (Signed)
Emergency Medicine Provider Triage Evaluation Note  Shawn Dougherty , a 51 y.o. male  was evaluated in triage.  Pt complains of abdominal pain that started around 1 AM that woke him up from sleep.  Has history of Crohn's and small bowel obstruction states this feels similar to his previous SBO.  Endorses nausea but no vomiting.  Loose stools this morning around 5 AM.  Denies other complaints.  Review of Systems  Positive: As above Negative: As above  Physical Exam  BP 134/87 (BP Location: Right Arm)   Pulse 70   Temp 98.2 F (36.8 C)   Resp 18   SpO2 100%  Gen:   Awake, no distress   Resp:  Normal effort  MSK:   Moves extremities without difficulty  Other:  Generalized abdominal tenderness with guarding  Medical Decision Making  Medically screening exam initiated at 9:24 AM.  Appropriate orders placed.  Shawn Dougherty was informed that the remainder of the evaluation will be completed by another provider, this initial triage assessment does not replace that evaluation, and the importance of remaining in the ED until their evaluation is complete.     Evlyn Courier, PA-C 01/04/22 [phone]

## 2022-01-04 NOTE — ED Triage Notes (Signed)
Pt. Stated, I have a small bowel obstruction started last night and Ive had it before related to Crohn's Disease.

## 2022-01-04 NOTE — H&P (Addendum)
History and Physical    Patient: Shawn Dougherty UMP:[id number] DOB: 09-09-1970 DOA: 01/04/2022 DOS: the patient was seen and examined on 01/04/2022 PCP: Burnard Bunting, MD  Patient coming from: Home  Chief Complaint:  Chief Complaint  Patient presents with   Abdominal Pain   Nausea   Diarrhea   small bowel obstruction   HPI: Shawn Dougherty is a 51 y.o. male with medical history significant of Crohn's disease with prior history of SBO who presented with complaints of abdominal pain starting this morning.  Reported associated symptoms of nausea without significant vomiting.  Last bowel movement this morning which was loose.  Patient has had episodes of small bowel obstruction before in the past and reports last occurred in March of this year.  Usually resolve with conservative management of NG tube, steroids, and IV fluids.  In the emergency department patient was noted to have stable vital signs and labs were relatively unremarkable. CT scan of the abdomen small bowel obstruction with transition zone in the left mid abdomen with focus of small bowel thickening concerning for the possibility of stricture or side effect of Crohn's disease  Review of Systems: As mentioned in the history of present illness. All other systems reviewed and are negative. Past Medical History:  Diagnosis Date   Crohn's disease ([facility])    SBO (small bowel obstruction) ([facility])    Past Surgical History:  Procedure Laterality Date   HERNIA REPAIR     Social History:  reports that he has never smoked. He does not have any smokeless tobacco history on file. He reports current alcohol use of about 3.0 standard drinks of alcohol per week. He reports that he does not use drugs.  Allergies  Allergen Reactions   Clindamycin/Lincomycin Hives   Penicillins Other (See Comments)    Difficulty breathing, tongue swells    No family history on file.  Prior to Admission medications   Medication Sig Start Date End Date  Taking? Authorizing Provider  benzonatate (TESSALON) 200 MG capsule Take 200 mg by mouth 3 (three) times daily as needed for cough. 04/19/21   [provider]  diphenhydrAMINE (BENADRYL) 25 MG tablet Take 25 mg by mouth every 6 (six) hours as needed for allergies.    [provider]  mesalamine (APRISO) 0.375 g 24 hr capsule Take 1.5 g by mouth daily. 01/07/21   [provider]    Physical Exam: Vitals:   01/04/22 0919 01/04/22 0928 01/04/22 1207 01/04/22 1514  BP: 134/87  124/87 133/88  Pulse: 70  65 66  Resp: _0 Temp: 98.2 F (36.8 C)  98.4 F (36.9 C) 98.1 F (36.7 C)  TempSrc:   Oral Oral  SpO2: 100%  100% 100%  Weight:  77.1 kg    Height:  _1  (1.702 m)      Constitutional: Middle-aged male currently in no acute distress Eyes: PERRL, lids and conjunctivae normal ENMT: Mucous membranes are moist.   Neck: normal, supple Respiratory: clear to auscultation bilaterally, no wheezing, no crackles. Normal respiratory effort.  Cardiovascular: Regular rate and rhythm, no murmurs / rubs / gallops. No extremity edema. 2+ pedal pulses. No carotid bruits.  Abdomen: Enlarged tenderness to palpation.  Bowel sounds decreased. Musculoskeletal: no clubbing / cyanosis. No joint deformity upper and lower extremities. Good ROM, no contractures. Normal muscle tone.  Skin: no rashes, lesions, ulcers. No induration Neurologic: CN 2-12 grossly intact. Sensation intact, DTR normal. Strength 5/5 in all 4.  Psychiatric: Normal judgment  and insight. Alert and oriented x 3. Normal mood.   Data Reviewed:  Reviewed labs as noted above in HPI. Assessment and Plan:]  Small bowel obstruction Acute.  Patient presents with complaints of abdominal pain and nausea.   CT scan of the abdomen pelvis noted small bowel obstruction with transition zone in the left mid abdomen with small focus of small bowel wall thickening concerning for stricture or active Crohn's disease.  Prior  history of small bowel obstructions in the past treated with conservative management. -Admit to a MedSurg bed -NGT to low intermittent suction -Normal saline IV fluids at 75 mL/h -Protonix 40 mg daily  -Antiemetics as needed -Morphine IV as needed for pain -General surgery consulted, we will follow-up for any further recommendations  Crohn's disease of small intestine with obstruction Acute.  Patient has history of Crohn's disease on mesalamine.  CT concerning for small bowel wall thickening to be active Crohn's flare. -Check ESR -Solu-Medrol 60 mg IV daily -Gastroenterology consulted, will follow-up for any further recommendations  DVT prophylaxis: Lovenox Advance Care Planning:   Code Status: Full Code    Consults: Gastroenterology and general surgery  Family Communication: None  Severity of Illness: The appropriate patient status for this patient is INPATIENT. Inpatient status is judged to be reasonable and necessary in order to provide the required intensity of service to ensure the patient's safety. The patient's presenting symptoms, physical exam findings, and initial radiographic and laboratory data in the context of their chronic comorbidities is felt to place them at high risk for further clinical deterioration. Furthermore, it is not anticipated that the patient will be medically stable for discharge from the hospital within 2 midnights of admission.   * I certify that at the point of admission it is my clinical judgment that the patient will require inpatient hospital care spanning beyond 2 midnights from the point of admission due to high intensity of service, high risk for further deterioration and high frequency of surveillance required.*  Author: Norval Morton, MD 01/04/2022 5:06 PM  For on call review [email or web address].

## 2022-01-04 NOTE — ED Provider Notes (Signed)
[facility] EMERGENCY DEPARTMENT Provider Note   CSN: [id number] Arrival date & time: 01/04/22  3762     History  Chief Complaint  Patient presents with   Abdominal Pain   Nausea   Diarrhea   small bowel obstruction    Johncarlo Maalouf is a 51 y.o. male.   Abdominal Pain Associated symptoms: nausea   Diarrhea Associated symptoms: abdominal pain   Patient presents for abdominal pain.  Medical history includes Crohn's disease, prior episodes of SBO.  Onset of pain was 1 AM.  It did wake him up from sleep.  It worsened throughout the night.  He has had associated nausea without vomiting.  Symptoms are reminiscent of prior episode of SBO.  He has several loose stools this morning, prior to 7 AM.  He has not had any bowel movements since then.  He is longer passing gas.  Patient is diagnosed with Crohn's disease approximately 25 years ago.  He has had intermittent episodes of SBO but has never required surgery.  He is followed by [facility].  For management of his Crohn's, he takes mesalamine only.  Yesterday, he was in his normal state of health.     Home Medications Prior to Admission medications   Medication Sig Start Date End Date Taking? Authorizing Provider  benzonatate (TESSALON) 200 MG capsule Take 200 mg by mouth 3 (three) times daily as needed for cough. 04/19/21   [provider]  diphenhydrAMINE (BENADRYL) 25 MG tablet Take 25 mg by mouth every 6 (six) hours as needed for allergies.    [provider]  mesalamine (APRISO) 0.375 g 24 hr capsule Take 1.5 g by mouth daily. 01/07/21   [provider]      Allergies    Clindamycin/lincomycin and Penicillins    Review of Systems   Review of Systems  Gastrointestinal:  Positive for abdominal pain and nausea.  All other systems reviewed and are negative.   Physical Exam Updated Vital Signs BP 133/88   Pulse 66   Temp 98.1 F (36.7 C) (Oral)   Resp 16   Ht 5' 7"  (1.702 m)    Wt 77.1 kg   SpO2 100%   BMI 26.63 kg/m  Physical Exam Vitals and nursing note reviewed.  Constitutional:      General: He is not in acute distress.    Appearance: He is well-developed. He is not ill-appearing, toxic-appearing or diaphoretic.  HENT:     Head: Normocephalic and atraumatic.  Eyes:     General: No scleral icterus.    Conjunctiva/sclera: Conjunctivae normal.  Cardiovascular:     Rate and Rhythm: Normal rate and regular rhythm.  Pulmonary:     Effort: Pulmonary effort is normal. No respiratory distress.  Abdominal:     Palpations: Abdomen is soft.  Musculoskeletal:        General: No swelling.     Cervical back: Neck supple.  Skin:    General: Skin is warm and dry.     Coloration: Skin is not cyanotic, jaundiced or pale.  Neurological:     General: No focal deficit present.     Mental Status: He is alert and oriented to person, place, and time.  Psychiatric:        Mood and Affect: Mood normal.        Behavior: Behavior normal.     ED Results / Procedures / Treatments   Labs (all labs ordered are listed, but only abnormal results are displayed)  Labs Reviewed  COMPREHENSIVE METABOLIC PANEL - Abnormal; Notable for the following components:      Result Value   Glucose, Bld 140 (*)    All other components within normal limits  CBC WITH DIFFERENTIAL/PLATELET  LIPASE, BLOOD  URINALYSIS, ROUTINE W REFLEX MICROSCOPIC    EKG None  Radiology CT ABDOMEN PELVIS W CONTRAST  Result Date: 01/04/2022 CLINICAL DATA:  Acute abdominal pain since 0100 hours, history of Crohn's disease and small-bowel obstruction, associated nausea without vomiting, states symptoms feel similar to prior bowel obstruction EXAM: CT ABDOMEN AND PELVIS WITH CONTRAST TECHNIQUE: Multidetector CT imaging of the abdomen and pelvis was performed using the standard protocol following bolus administration of intravenous contrast. RADIATION DOSE REDUCTION: This exam was performed according to the  departmental dose-optimization program which includes automated exposure control, adjustment of the mA and/or kV according to patient size and/or use of iterative reconstruction technique. CONTRAST:  57m OMNIPAQUE IOHEXOL 350 MG/ML SOLN IV. No oral contrast. COMPARISON:  06/22/2021 FINDINGS: Lower chest: Lung bases clear Hepatobiliary: Gallbladder and liver normal appearance Pancreas: Normal appearance Spleen: Normal appearance Adrenals/Urinary Tract: Adrenal glands, kidneys, ureters, and bladder normal appearance. No urinary tract calcification or dilatation. Stomach/Bowel: Normal appendix. Stomach unremarkable. Colon decompressed. Dilated proximal and decompressed distal small bowel loops consistent with small bowel obstruction. Transition zone in LEFT mid abdomen image 45 with small focus of small bowel wall thickening, may represent stricture or site of active Crohn's disease. Remaining small bowel loops unremarkable. No ileal wall thickening. Vascular/Lymphatic: Vascular structures patent. Aorta normal caliber. No adenopathy. Reproductive: Unremarkable prostate gland and seminal vesicles Other: Small umbilical hernia containing fat. No free air. Small amount of nonspecific free pelvic fluid. Musculoskeletal: Osseous structures unremarkable. IMPRESSION: Small bowel obstruction with transition zone in LEFT mid abdomen with small focus of small bowel wall thickening, may represent stricture or site of active Crohn's disease. Small amount of nonspecific free pelvic fluid. Small umbilical hernia containing fat. Electronically Signed   By: MLavonia DanaM.D.   On: 01/04/2022 15:22    Procedures Procedures    Medications Ordered in ED Medications  morphine (PF) 2 MG/ML injection 2 mg (has no administration in time range)  lactated ringers bolus 1,000 mL (has no administration in time range)  lactated ringers infusion (has no administration in time range)  HYDROcodone-acetaminophen (NORCO/VICODIN) 5-325 MG  per tablet 1 tablet (1 tablet Oral Given 01/04/22 0933)  ondansetron (ZOFRAN-ODT) disintegrating tablet 8 mg (8 mg Oral Given 01/04/22 0933)  iohexol (OMNIPAQUE) 350 MG/ML injection 75 mL (75 mLs Intravenous Contrast Given 01/04/22 1507)    ED Course/ Medical Decision Making/ A&P                           Medical Decision Making  This patient presents to the ED for concern of abdominal pain, this involves an extensive number of treatment options, and is a complaint that carries with it a high risk of complications and morbidity.  The differential diagnosis includes Crohn's flare, SBO, constipation, enteritis, colitis   Co morbidities that complicate the patient evaluation  Crohn's disease   Additional history obtained:  Additional history obtained from N/A External records from outside source obtained and reviewed including EMR   Lab Tests:  I Ordered, and personally interpreted labs.  The pertinent results include: Normal hemoglobin, no leukocytosis, normal hepatobiliary enzymes, normal lipase   Imaging Studies ordered:  I ordered imaging studies including CT of abdomen and pelvis I  independently visualized and interpreted imaging which showed SBO I agree with the radiologist interpretation   Cardiac Monitoring: / EKG:  The patient was maintained on a cardiac monitor.  I personally viewed and interpreted the cardiac monitored which showed an underlying rhythm of: Sinus rhythm  Problem List / ED Course / Critical interventions / Medication management  Patient presents for abdominal pain since this morning.  Prior to being bedded in the ED, diagnostic workup was initiated.  Lab results are unremarkable.  CT of abdomen pelvis shows small bowel obstruction.  On assessment, patient is well-appearing.  Abdomen is nondistended.  IV fluids ordered for hydration.  Dilaudid was ordered for analgesia.  NG tube was ordered.  Patient was admitted to medicine for further management. I  ordered medication including IV fluids for hydration; morphine for analgesia Reevaluation of the patient after these medicines showed that the patient improved I have reviewed the patients home medicines and have made adjustments as needed   Social Determinants of Health:  Has access to outpatient care          Final Clinical Impression(s) / ED Diagnoses Final diagnoses:  Small bowel obstruction [facility])    Rx / DC Orders ED Discharge Orders     None         Godfrey Pick, MD 01/04/22 1704

## 2022-01-04 NOTE — Progress Notes (Signed)
Pt assessed, Ng tube placed, xray ordered

## 2022-01-05 DIAGNOSIS — K56609 Unspecified intestinal obstruction, unspecified as to partial versus complete obstruction: Secondary | ICD-10-CM | POA: Diagnosis not present

## 2022-01-05 LAB — BASIC METABOLIC PANEL
Anion gap: 8 (ref 5–15)
BUN: 8 mg/dL (ref 6–20)
CO2: 27 mmol/L (ref 22–32)
Calcium: 9.4 mg/dL (ref 8.9–10.3)
Chloride: 103 mmol/L (ref 98–111)
Creatinine, Ser: 1.21 mg/dL (ref 0.61–1.24)
GFR, Estimated: >60 mL/min (ref >60)
Glucose, Bld: 153 mg/dL — ABNORMAL HIGH (ref 70–99)
Potassium: 4.6 mmol/L (ref 3.5–5.1)
Sodium: 138 mmol/L (ref 135–145)

## 2022-01-05 LAB — CBC
HCT: 43.1 % (ref 39.0–52.0)
Hemoglobin: 14.8 g/dL (ref 13.0–17.0)
MCH: 29.7 pg (ref 26.0–34.0)
MCHC: 34.3 g/dL (ref 30.0–36.0)
MCV: 86.4 fL (ref 80.0–100.0)
Platelets: 233 10*3/uL (ref 150–400)
RBC: 4.99 MIL/uL (ref 4.22–5.81)
RDW: 13.5 % (ref 11.5–15.5)
WBC: 7.3 10*3/uL (ref 4.0–10.5)
nRBC: 0 % (ref 0.0–0.2)

## 2022-01-05 LAB — SEDIMENTATION RATE: Sed Rate: 6 mm/hr (ref 0–16)

## 2022-01-05 NOTE — Consult Note (Signed)
Referring Provider:  [facility] Primary Care Physician:  Burnard Bunting, MD Primary Gastroenterologist:  Dr. Watt Climes  Reason for Consultation: Abdominal pain  HPI: Shawn Dougherty is a 51 y.o. male with past medical history of Crohn's disease with recurrent small bowel obstruction presented to the hospital with abdominal pain and nausea.  CT abdomen pelvis with contrast yesterday showed small bowel obstruction with a transition zone in the left mid abdomen with small focus of small bowel wall thickening could be from stricture or active Crohn's disease.  Was also seen by surgery.  Started on IV steroids.  Conservative management recommended at this time.  GI is also consulted for further management.  Patient seen and examined at bedside.  He is feeling much better today.  Abdominal pain resolved.  No bowel movement today but passing flatus.  He wants to have his NG tube removed today.    Past Medical History:  Diagnosis Date   Crohn's disease ([place])    SBO (small bowel obstruction) ([place])     Past Surgical History:  Procedure Laterality Date   HERNIA REPAIR      Prior to Admission medications   Medication Sig Start Date End Date Taking? Authorizing Provider  diphenhydrAMINE (BENADRYL) 25 MG tablet Take 25 mg by mouth every 6 (six) hours as needed for allergies.   Yes [provider]  mesalamine (APRISO) 0.375 g 24 hr capsule Take 1.5 g by mouth daily. 01/07/21  Yes [provider]    Scheduled Meds:  enoxaparin (LOVENOX) injection  40 mg Subcutaneous Q24H   methylPREDNISolone (SOLU-MEDROL) injection  60 mg Intravenous Daily   pantoprazole (PROTONIX) IV  40 mg Intravenous Q24H   sodium chloride flush  3 mL Intravenous Q12H   Continuous Infusions:  lactated ringers 100 mL/hr at 01/04/22 2151   PRN Meds:.acetaminophen **OR** acetaminophen, albuterol, morphine injection, ondansetron **OR** ondansetron (ZOFRAN) IV  Allergies as of 01/04/2022 - Review Complete 01/04/2022   Allergen Reaction Noted   Clindamycin/lincomycin Hives 01/26/2013   Penicillins Other (See Comments) 01/26/2013    History reviewed. No pertinent family history.  Social History   Socioeconomic History   Marital status: Married    Spouse name: Not on file   Number of children: Not on file   Years of education: Not on file   Highest education level: Not on file  Occupational History   Occupation: attorney  Tobacco Use   Smoking status: Never   Smokeless tobacco: Not on file  Vaping Use   Vaping Use: Never used  Substance and Sexual Activity   Alcohol use: Yes    Alcohol/week: 4.0 standard drinks of alcohol    Types: 4 Standard drinks or equivalent per week    Comment: a few per week   Drug use: No   Sexual activity: Yes  Other Topics Concern   Not on file  Social History Narrative   Not on file   Social Determinants of Health   Financial Resource Strain: Not on file  Food Insecurity: No Food Insecurity (01/04/2022)   Hunger Vital Sign    Worried About Running Out of Food in the Last Year: Never true    Ran Out of Food in the Last Year: Never true  Transportation Needs: No Transportation Needs (01/04/2022)   PRAPARE - Hydrologist (Medical): No    Lack of Transportation (Non-Medical): No  Physical Activity: Not on file  Stress: Not on file  Social Connections: Not on file  Intimate Partner  Violence: Not At Risk (01/04/2022)   Humiliation, Afraid, Rape, and Kick questionnaire    Fear of Current or Ex-Partner: No    Emotionally Abused: No    Physically Abused: No    Sexually Abused: No    Review of Systems: All negative except as stated above in HPI.  Physical Exam: Vital signs: Vitals:   01/05/22 0449 01/05/22 0814  BP: 132/79 125/80  Pulse: 64 66  Resp: 15 17  Temp: 98.4 F (36.9 C) 98 F (36.7 C)  SpO2: 100% 96%     General:   Alert,  Well-developed, well-nourished, pleasant and cooperative in NAD HEENT - NS, AT, NG  tube in place with intermittent suction Lungs: No visible respiratory distress Heart:  Regular rate and rhythm; no murmurs, clicks, rubs,  or gallops. Abdomen: Soft, nontender, nondistended, bowel sounds present, no peritoneal signs Neuro-alert and oriented x 3 Psych-mood and affect normal Rectal:  Deferred  GI:  Lab Results: Recent Labs    01/04/22 0944 01/05/22 0247  WBC 7.3 7.3  HGB 15.6 14.8  HCT 46.3 43.1  PLT 252 233   BMET Recent Labs    01/04/22 0944 01/05/22 0247  NA 137 138  K 4.1 4.6  CL 103 103  CO2 24 27  GLUCOSE 140* 153*  BUN 9 8  CREATININE 1.15 1.21  CALCIUM 9.5 9.4   LFT Recent Labs    01/04/22 0944  PROT 7.0  ALBUMIN 4.3  AST 27  ALT 22  ALKPHOS 48  BILITOT 0.7   PT/INR No results for input(s): "LABPROT", "INR" in the last 72 hours.   Studies/Results: DG Abd 1 View  Result Date: 01/04/2022 CLINICAL DATA:  Enteric catheter placement EXAM: ABDOMEN - 1 VIEW COMPARISON:  01/04/2022 FINDINGS: Supine frontal view of the abdomen and pelvis excludes hemidiaphragms and lower pelvis by collimation. Enteric catheter tip and side port project over the gastric fundus. Distended gas-filled loops of small bowel within the left mid abdomen are stable consistent with obstruction. No masses or abnormal calcifications. IMPRESSION: 1. Enteric catheter tip projecting over the gastric fundus. Electronically Signed   By: Randa Ngo M.D.   On: 01/04/2022 21:53   CT ABDOMEN PELVIS W CONTRAST  Result Date: 01/04/2022 CLINICAL DATA:  Acute abdominal pain since 0100 hours, history of Crohn's disease and small-bowel obstruction, associated nausea without vomiting, states symptoms feel similar to prior bowel obstruction EXAM: CT ABDOMEN AND PELVIS WITH CONTRAST TECHNIQUE: Multidetector CT imaging of the abdomen and pelvis was performed using the standard protocol following bolus administration of intravenous contrast. RADIATION DOSE REDUCTION: This exam was performed  according to the departmental dose-optimization program which includes automated exposure control, adjustment of the mA and/or kV according to patient size and/or use of iterative reconstruction technique. CONTRAST:  42m OMNIPAQUE IOHEXOL 350 MG/ML SOLN IV. No oral contrast. COMPARISON:  06/22/2021 FINDINGS: Lower chest: Lung bases clear Hepatobiliary: Gallbladder and liver normal appearance Pancreas: Normal appearance Spleen: Normal appearance Adrenals/Urinary Tract: Adrenal glands, kidneys, ureters, and bladder normal appearance. No urinary tract calcification or dilatation. Stomach/Bowel: Normal appendix. Stomach unremarkable. Colon decompressed. Dilated proximal and decompressed distal small bowel loops consistent with small bowel obstruction. Transition zone in LEFT mid abdomen image 45 with small focus of small bowel wall thickening, may represent stricture or site of active Crohn's disease. Remaining small bowel loops unremarkable. No ileal wall thickening. Vascular/Lymphatic: Vascular structures patent. Aorta normal caliber. No adenopathy. Reproductive: Unremarkable prostate gland and seminal vesicles Other: Small umbilical hernia containing fat.  No free air. Small amount of nonspecific free pelvic fluid. Musculoskeletal: Osseous structures unremarkable. IMPRESSION: Small bowel obstruction with transition zone in LEFT mid abdomen with small focus of small bowel wall thickening, may represent stricture or site of active Crohn's disease. Small amount of nonspecific free pelvic fluid. Small umbilical hernia containing fat. Electronically Signed   By: Lavonia Dana M.D.   On: 01/04/2022 15:22    Impression/Plan: -Small bowel obstruction likely from underlying active Crohn's disease. -History of recurrent small bowel obstruction. -Chronic Crohn's disease.  On mesalamine.  Recommendations -------------------------- Petra Kuba of Crohn's disease and his recurrent obstruction discussed with the patient.  Option  for starting biological agent as an outpatient also discussed.  Patient is concerned about side effects from biological agent and wants to continue using mesalamine for now.  Advised him to discuss with Dr. Watt Climes as an outpatient for further management of his Crohn's disease. -Continue IV Solu-Medrol for today. -Okay to remove NG tube from GI standpoint later today and to try clear liquid diet.  Discussed with hospitalist and surgical team.  -Orders for NG tube discontinuation and clear liquid diet placed.  -Hopefully discharge tomorrow on oral steroids.  GI will follow    LOS: 1 day   Otis Brace  MD, FACP 01/05/2022, 10:37 AM  Contact #  [phone]

## 2022-01-05 NOTE — Consult Note (Signed)
Reason for Consult:Crohn's with SBO Referring Physician: [name] is an 51 y.o. male.  HPI: 51yo M with HX Crohn's disease followed by Dr. Watt Climes presented to the ED with abdominal pain and nausea yesterday. Had a loose BM yesterday AM.  He has a history of previous small bowel obstructions associated with his Crohn's disease, mostly recently in March of this year.  He had nasogastric tube placed in the emergency department and he reports he is feeling significantly better already.  He is passing some gas.  No bowel movements.  Past Medical History:  Diagnosis Date   Crohn's disease ([place])    SBO (small bowel obstruction) ([place])     Past Surgical History:  Procedure Laterality Date   HERNIA REPAIR      History reviewed. No pertinent family history.  Social History:  reports that he has never smoked. He does not have any smokeless tobacco history on file. He reports current alcohol use of about 4.0 standard drinks of alcohol per week. He reports that he does not use drugs.  Allergies:  Allergies  Allergen Reactions   Clindamycin/Lincomycin Hives   Penicillins Other (See Comments)    Difficulty breathing, tongue swells    Medications: I have reviewed the patient's current medications.  Results for orders placed or performed during the hospital encounter of 01/04/22 (from the past 48 hour(s))  CBC with Differential     Status: None   Collection Time: 01/04/22  9:44 AM  Result Value Ref Range   WBC 7.3 4.0 - 10.5 K/uL   RBC 5.38 4.22 - 5.81 MIL/uL   Hemoglobin 15.6 13.0 - 17.0 g/dL   HCT 46.3 39.0 - 52.0 %   MCV 86.1 80.0 - 100.0 fL   MCH 29.0 26.0 - 34.0 pg   MCHC 33.7 30.0 - 36.0 g/dL   RDW 13.5 11.5 - 15.5 %   Platelets 252 150 - 400 K/uL   nRBC 0.0 0.0 - 0.2 %   Neutrophils Relative % 88 %   Neutro Abs 6.5 1.7 - 7.7 K/uL   Lymphocytes Relative 9 %   Lymphs Abs 0.7 0.7 - 4.0 K/uL   Monocytes Relative 2 %   Monocytes Absolute 0.1 0.1 - 1.0 K/uL    Eosinophils Relative 0 %   Eosinophils Absolute 0.0 0.0 - 0.5 K/uL   Basophils Relative 0 %   Basophils Absolute 0.0 0.0 - 0.1 K/uL   Immature Granulocytes 1 %   Abs Immature Granulocytes 0.04 0.00 - 0.07 K/uL    Comment: Performed at [facility], [address] [place], [place] [address]  Comprehensive metabolic panel     Status: Abnormal   Collection Time: 01/04/22  9:44 AM  Result Value Ref Range   Sodium 137 135 - 145 mmol/L   Potassium 4.1 3.5 - 5.1 mmol/L   Chloride 103 98 - 111 mmol/L   CO2 24 22 - 32 mmol/L   Glucose, Bld 140 (H) 70 - 99 mg/dL    Comment: Glucose reference range applies only to samples taken after fasting for at least 8 hours.   BUN 9 6 - 20 mg/dL   Creatinine, Ser 1.15 0.61 - 1.24 mg/dL   Calcium 9.5 8.9 - 10.3 mg/dL   Total Protein 7.0 6.5 - 8.1 g/dL   Albumin 4.3 3.5 - 5.0 g/dL   AST 27 15 - 41 U/L   ALT 22 0 - 44 U/L   Alkaline Phosphatase 48 38 - 126  U/L   Total Bilirubin 0.7 0.3 - 1.2 mg/dL   GFR, Estimated >60 >60 mL/min    Comment: (NOTE) Calculated using the CKD-EPI Creatinine Equation (2021)    Anion gap 10 5 - 15    Comment: Performed at [facility] [address] [place], [place] [address]  Lipase, blood     Status: None   Collection Time: 01/04/22  9:44 AM  Result Value Ref Range   Lipase 46 11 - 51 U/L    Comment: Performed at [facility] [address], [place], [place] [address]  Urinalysis, Routine w reflex microscopic Urine, Clean Catch     Status: None   Collection Time: 01/04/22 10:07 AM  Result Value Ref Range   Color, Urine YELLOW YELLOW   APPearance CLEAR CLEAR   Specific Gravity, Urine 1.016 1.005 - 1.030   pH 5.0 5.0 - 8.0   Glucose, UA NEGATIVE NEGATIVE mg/dL   Hgb urine dipstick NEGATIVE NEGATIVE   Bilirubin Urine NEGATIVE NEGATIVE   Ketones, ur NEGATIVE NEGATIVE mg/dL   Protein, ur NEGATIVE NEGATIVE mg/dL   Nitrite NEGATIVE NEGATIVE   Leukocytes,Ua NEGATIVE NEGATIVE    Comment: Performed at  [facility] [address], [place] [address]  CBC     Status: None   Collection Time: 01/05/22  2:47 AM  Result Value Ref Range   WBC 7.3 4.0 - 10.5 K/uL   RBC 4.99 4.22 - 5.81 MIL/uL   Hemoglobin 14.8 13.0 - 17.0 g/dL   HCT 43.1 39.0 - 52.0 %   MCV 86.4 80.0 - 100.0 fL   MCH 29.7 26.0 - 34.0 pg   MCHC 34.3 30.0 - 36.0 g/dL   RDW 13.5 11.5 - 15.5 %   Platelets 233 150 - 400 K/uL   nRBC 0.0 0.0 - 0.2 %    Comment: Performed at [facility], [place] [address] [place], [place] [address]  Basic metabolic panel     Status: Abnormal   Collection Time: 01/05/22  2:47 AM  Result Value Ref Range   Sodium 138 135 - 145 mmol/L   Potassium 4.6 3.5 - 5.1 mmol/L   Chloride 103 98 - 111 mmol/L   CO2 27 22 - 32 mmol/L   Glucose, Bld 153 (H) 70 - 99 mg/dL    Comment: Glucose reference range applies only to samples taken after fasting for at least 8 hours.   BUN 8 6 - 20 mg/dL   Creatinine, Ser 1.21 0.61 - 1.24 mg/dL   Calcium 9.4 8.9 - 10.3 mg/dL   GFR, Estimated >60 >60 mL/min    Comment: (NOTE) Calculated using the CKD-EPI Creatinine Equation (2021)    Anion gap 8 5 - 15    Comment: Performed at [facility] [address] [place], [place] [address]  Sedimentation rate     Status: None   Collection Time: 01/05/22  2:47 AM  Result Value Ref Range   Sed Rate 6 0 - 16 mm/hr    Comment: Performed at [facility] [address], [place],  [address]    DG Abd 1 View  Result Date: 01/04/2022 CLINICAL DATA:  Enteric catheter placement EXAM: ABDOMEN - 1 VIEW COMPARISON:  01/04/2022 FINDINGS: Supine frontal view of the abdomen and pelvis excludes hemidiaphragms and lower pelvis by collimation. Enteric catheter tip and side port project over the gastric fundus. Distended gas-filled loops of small bowel within the left mid  abdomen are stable consistent with obstruction. No masses or abnormal calcifications. IMPRESSION: 1. Enteric catheter tip projecting  over the gastric fundus. Electronically Signed   By: Randa Ngo M.D.   On: 01/04/2022 21:53   CT ABDOMEN PELVIS W CONTRAST  Result Date: 01/04/2022 CLINICAL DATA:  Acute abdominal pain since 0100 hours, history of Crohn's disease and small-bowel obstruction, associated nausea without vomiting, states symptoms feel similar to prior bowel obstruction EXAM: CT ABDOMEN AND PELVIS WITH CONTRAST TECHNIQUE: Multidetector CT imaging of the abdomen and pelvis was performed using the standard protocol following bolus administration of intravenous contrast. RADIATION DOSE REDUCTION: This exam was performed according to the departmental dose-optimization program which includes automated exposure control, adjustment of the mA and/or kV according to patient size and/or use of iterative reconstruction technique. CONTRAST:  80m OMNIPAQUE IOHEXOL 350 MG/ML SOLN IV. No oral contrast. COMPARISON:  06/22/2021 FINDINGS: Lower chest: Lung bases clear Hepatobiliary: Gallbladder and liver normal appearance Pancreas: Normal appearance Spleen: Normal appearance Adrenals/Urinary Tract: Adrenal glands, kidneys, ureters, and bladder normal appearance. No urinary tract calcification or dilatation. Stomach/Bowel: Normal appendix. Stomach unremarkable. Colon decompressed. Dilated proximal and decompressed distal small bowel loops consistent with small bowel obstruction. Transition zone in LEFT mid abdomen image 45 with small focus of small bowel wall thickening, may represent stricture or site of active Crohn's disease. Remaining small bowel loops unremarkable. No ileal wall thickening. Vascular/Lymphatic: Vascular structures patent. Aorta normal caliber. No adenopathy. Reproductive: Unremarkable prostate gland and seminal vesicles Other: Small umbilical hernia containing fat. No free air. Small amount of nonspecific free pelvic fluid. Musculoskeletal: Osseous structures unremarkable. IMPRESSION: Small bowel obstruction with transition  zone in LEFT mid abdomen with small focus of small bowel wall thickening, may represent stricture or site of active Crohn's disease. Small amount of nonspecific free pelvic fluid. Small umbilical hernia containing fat. Electronically Signed   By: MLavonia DanaM.D.   On: 01/04/2022 15:22    Review of Systems  Constitutional:  Positive for appetite change.  HENT: Negative.    Eyes: Negative.   Respiratory: Negative.    Cardiovascular: Negative.   Gastrointestinal:  Positive for abdominal pain.  Endocrine: Negative.   Genitourinary: Negative.   Musculoskeletal: Negative.   Allergic/Immunologic: Negative.   Neurological: Negative.   Hematological: Negative.   Psychiatric/Behavioral: Negative.     Blood pressure 125/80, pulse 66, temperature 98 F (36.7 C), temperature source Oral, resp. rate 17, height 5' 7"  (1.702 m), weight 77.1 kg, SpO2 96 %. Physical Exam Eyes:     Extraocular Movements: Extraocular movements intact.  Cardiovascular:     Rate and Rhythm: Normal rate and regular rhythm.  Pulmonary:     Effort: Pulmonary effort is normal.     Breath sounds: Normal breath sounds. No wheezing.  Abdominal:     General: Bowel sounds are decreased.     Palpations: Abdomen is soft.     Tenderness: There is no abdominal tenderness.     Hernia: No hernia is present.     Comments: Minimal distention  Skin:    General: Skin is warm.  Neurological:     Mental Status: He is alert and oriented to person, place, and time.  Psychiatric:        Mood and Affect: Mood normal.     Assessment/Plan: Crohn's disease with SBO -agree with IV steroids, nasogastric tube decompression and GI consultation.  Hopefully he will continue to improve.  No need for emergent surgery.  We will follow with you.  Shawn Dougherty 01/05/2022, 8:25 AM

## 2022-01-05 NOTE — TOC Initial Note (Signed)
Transition of Care [facility]) - Initial/Assessment Note    Patient Details  Name: Shawn Dougherty MRN: [id number] Date of Birth: [birth year]  Transition of Care [facility]) CM/SW Contact:    Ninfa Meeker, RN Phone Number: 01/05/2022, 10:43 AM  Clinical Narrative:                 Transition of Care Initial screening Note:  Transition of Care [facility]) Department has reviewed patient and no TOC needs have been identified at this time. We will continue to monitor patient advancement through Interdisciplinary progressions and if new patient needs arise, please place a consult.        Patient Goals and CMS Choice        Expected Discharge Plan and Services                                                Prior Living Arrangements/Services                       Activities of Daily Living Home Assistive Devices/Equipment: None ADL Screening (condition at time of admission) Patient's cognitive ability adequate to safely complete daily activities?: Yes Is the patient deaf or have difficulty hearing?: No Does the patient have difficulty seeing, even when wearing glasses/contacts?: No Does the patient have difficulty concentrating, remembering, or making decisions?: No Patient able to express need for assistance with ADLs?: Yes Does the patient have difficulty dressing or bathing?: No Independently performs ADLs?: Yes (appropriate for developmental age) Does the patient have difficulty walking or climbing stairs?: No Weakness of Legs: None Weakness of Arms/Hands: None  Permission Sought/Granted                  Emotional Assessment              Admission diagnosis:  Small bowel obstruction ([place]) [K56.609] SBO (small bowel obstruction) ([place]) [K56.609] Patient Active Problem List   Diagnosis Date Noted   Leukocytosis 04/24/2021   Hyponatremia 04/22/2021   Acute sinusitis 04/22/2021   Partial small bowel obstruction ([place]) 01/13/2021   Pain in left knee  08/22/2017   Crohn's disease of small intestine ([place]) 01/24/2014   SBO (small bowel obstruction) ([place]) 01/27/2013   Crohn disease ([place]) 01/27/2013   Dehydration 01/27/2013   PCP:  Burnard Bunting, MD Pharmacy:   [id number] [place] [address] [address]Phone: [phone]: [phone] [id number]: [phone]: [phone]    Social Determinants of Health (SDOH) Interventions    Readmission Risk Interventions     No data to display

## 2022-01-05 NOTE — Progress Notes (Signed)
[facility] PROGRESS NOTE   Shawn Dougherty DGU:[id number] DOB: Dec 02, 1970 DOA: 01/04/2022  PCP: Burnard Bunting, MD  Brief History/Interval Summary:  51 y.o. male with medical history significant of Crohn's disease with prior history of SBO who presented with complaints of abdominal pain along with nausea and vomiting.  Was found to have small bowel obstruction.  Hospitalized for further management.   Consultants: General surgery.  Gastroenterology  Procedures: None yet    Subjective/Interval History: Patient mentions that he is feeling better.  Has passed a lot of gas overnight.  Denies any nausea.  No chest pain or shortness of breath.    Assessment/Plan:  Small bowel obstruction This is in the setting of known Crohn's disease.  CT scan showed transition point in the left mid abdomen. General surgery is following.  Gastroenterology has been consulted. NG tube was placed overnight.  Patient has improved.  Is passing gas.  Defer further management to general surgery.  History of Crohn's disease Noted to be on mesalamine prior to admission.  Started on Solu-Medrol by admitting providers.  Waiting on gastroenterology input. Followed by [facility] gastroenterology, Dr. Watt Climes.  DVT Prophylaxis: Lovenox Code Status: Full code Family Communication: Discussed with the patient Disposition Plan: Hopefully return home when improved.  Mobilize.  Status is: Inpatient Remains inpatient appropriate because: Small bowel obstruction      Medications: Scheduled:  enoxaparin (LOVENOX) injection  40 mg Subcutaneous Q24H   methylPREDNISolone (SOLU-MEDROL) injection  60 mg Intravenous Daily   pantoprazole (PROTONIX) IV  40 mg Intravenous Q24H   sodium chloride flush  3 mL Intravenous Q12H   Continuous:  lactated ringers 100 mL/hr at 01/04/22 2151   ZDG:LOVFIEPPIRJJO **OR** acetaminophen, albuterol, morphine injection, ondansetron **OR** ondansetron (ZOFRAN)  IV  Antibiotics: Anti-infectives (From admission, onward)    None       Objective:  Vital Signs  Vitals:   01/04/22 1931 01/04/22 2004 01/05/22 0449 01/05/22 0814  BP: 135/85 139/81 132/79 125/80  Pulse: 70 (!) 57 64 66  Resp: 18 17 15 17   Temp: 98.2 F (36.8 C) 98.1 F (36.7 C) 98.4 F (36.9 C) 98 F (36.7 C)  TempSrc: Oral Oral Oral Oral  SpO2: 100% 100% 100% 96%  Weight:      Height:       No intake or output data in the 24 hours ending 01/05/22 0926 Filed Weights   01/04/22 0928  Weight: 77.1 kg    General appearance: Awake alert.  In no distress Resp: Clear to auscultation bilaterally.  Normal effort Cardio: S1-S2 is normal regular.  No S3-S4.  No rubs murmurs or bruit GI: Abdomen is soft.  Nontender nondistended.  Bowel sounds are present normal.  No masses organomegaly Extremities: No edema.  Full range of motion of lower extremities. Neurologic: Alert and oriented x3.  No focal neurological deficits.    Lab Results:  Data Reviewed: I have personally reviewed following labs and reports of the imaging studies  CBC: Recent Labs  Lab 01/04/22 0944 01/05/22 0247  WBC 7.3 7.3  NEUTROABS 6.5  --   HGB 15.6 14.8  HCT 46.3 43.1  MCV 86.1 86.4  PLT 252 841    Basic Metabolic Panel: Recent Labs  Lab 01/04/22 0944 01/05/22 0247  NA 137 138  K 4.1 4.6  CL 103 103  CO2 24 27  GLUCOSE 140* 153*  BUN 9 8  CREATININE 1.15 1.21  CALCIUM 9.5 9.4    GFR: Estimated Creatinine Clearance: 68.3 mL/min (by  C-G formula based on SCr of 1.21 mg/dL).  Liver Function Tests: Recent Labs  Lab 01/04/22 0944  AST 27  ALT 22  ALKPHOS 48  BILITOT 0.7  PROT 7.0  ALBUMIN 4.3    Recent Labs  Lab 01/04/22 0944  LIPASE 46     Radiology Studies: DG Abd 1 View  Result Date: 01/04/2022 CLINICAL DATA:  Enteric catheter placement EXAM: ABDOMEN - 1 VIEW COMPARISON:  01/04/2022 FINDINGS: Supine frontal view of the abdomen and pelvis excludes hemidiaphragms  and lower pelvis by collimation. Enteric catheter tip and side port project over the gastric fundus. Distended gas-filled loops of small bowel within the left mid abdomen are stable consistent with obstruction. No masses or abnormal calcifications. IMPRESSION: 1. Enteric catheter tip projecting over the gastric fundus. Electronically Signed   By: Randa Ngo M.D.   On: 01/04/2022 21:53   CT ABDOMEN PELVIS W CONTRAST  Result Date: 01/04/2022 CLINICAL DATA:  Acute abdominal pain since 0100 hours, history of Crohn's disease and small-bowel obstruction, associated nausea without vomiting, states symptoms feel similar to prior bowel obstruction EXAM: CT ABDOMEN AND PELVIS WITH CONTRAST TECHNIQUE: Multidetector CT imaging of the abdomen and pelvis was performed using the standard protocol following bolus administration of intravenous contrast. RADIATION DOSE REDUCTION: This exam was performed according to the departmental dose-optimization program which includes automated exposure control, adjustment of the mA and/or kV according to patient size and/or use of iterative reconstruction technique. CONTRAST:  54m OMNIPAQUE IOHEXOL 350 MG/ML SOLN IV. No oral contrast. COMPARISON:  06/22/2021 FINDINGS: Lower chest: Lung bases clear Hepatobiliary: Gallbladder and liver normal appearance Pancreas: Normal appearance Spleen: Normal appearance Adrenals/Urinary Tract: Adrenal glands, kidneys, ureters, and bladder normal appearance. No urinary tract calcification or dilatation. Stomach/Bowel: Normal appendix. Stomach unremarkable. Colon decompressed. Dilated proximal and decompressed distal small bowel loops consistent with small bowel obstruction. Transition zone in LEFT mid abdomen image 45 with small focus of small bowel wall thickening, may represent stricture or site of active Crohn's disease. Remaining small bowel loops unremarkable. No ileal wall thickening. Vascular/Lymphatic: Vascular structures patent. Aorta normal  caliber. No adenopathy. Reproductive: Unremarkable prostate gland and seminal vesicles Other: Small umbilical hernia containing fat. No free air. Small amount of nonspecific free pelvic fluid. Musculoskeletal: Osseous structures unremarkable. IMPRESSION: Small bowel obstruction with transition zone in LEFT mid abdomen with small focus of small bowel wall thickening, may represent stricture or site of active Crohn's disease. Small amount of nonspecific free pelvic fluid. Small umbilical hernia containing fat. Electronically Signed   By: MLavonia DanaM.D.   On: 01/04/2022 15:22       LOS: 1 day   [facility] Pager on [email or web address]  01/05/2022, 9:26 AM

## 2022-01-06 LAB — CBC
HCT: 38.4 % — ABNORMAL LOW (ref 39.0–52.0)
Hemoglobin: 13.4 g/dL (ref 13.0–17.0)
MCH: 30.1 pg (ref 26.0–34.0)
MCHC: 34.9 g/dL (ref 30.0–36.0)
MCV: 86.3 fL (ref 80.0–100.0)
Platelets: 209 10*3/uL (ref 150–400)
RBC: 4.45 MIL/uL (ref 4.22–5.81)
RDW: 13.2 % (ref 11.5–15.5)
WBC: 12.2 10*3/uL — ABNORMAL HIGH (ref 4.0–10.5)
nRBC: 0 % (ref 0.0–0.2)

## 2022-01-06 LAB — MAGNESIUM: Magnesium: 1.8 mg/dL (ref 1.7–2.4)

## 2022-01-06 LAB — BASIC METABOLIC PANEL
Anion gap: 8 (ref 5–15)
BUN: 11 mg/dL (ref 6–20)
CO2: 29 mmol/L (ref 22–32)
Calcium: 8.9 mg/dL (ref 8.9–10.3)
Chloride: 102 mmol/L (ref 98–111)
Creatinine, Ser: 1.11 mg/dL (ref 0.61–1.24)
GFR, Estimated: >60 mL/min (ref >60)
Glucose, Bld: 104 mg/dL — ABNORMAL HIGH (ref 70–99)
Potassium: 3.7 mmol/L (ref 3.5–5.1)
Sodium: 139 mmol/L (ref 135–145)

## 2022-01-06 MED ORDER — PREDNISONE 10 MG PO TABS: ORAL_TABLET | ORAL | 0 refills | Status: DC

## 2022-01-06 NOTE — Progress Notes (Signed)
[facility] Gastroenterology Progress Note  Shawn Dougherty 51 y.o. [birth year]  CC: Small bowel obstruction from Crohn's disease   Subjective: Patient seen and examined at bedside.  Feeling much better.  Tolerating liquid diet.  Abdominal pain resolved.  Passing flatus.  Wants to go home.  ROS : afebrile, negative for nausea and vomiting.   Objective: Vital signs in last 24 hours: Vitals:   01/06/22 0342 01/06/22 0802  BP: 121/79 122/73  Pulse: 65 (!) 58  Resp: 17 18  Temp: 98.9 F (37.2 C) 98.4 F (36.9 C)  SpO2: 98% 97%    Physical Exam:  General:  Alert, cooperative, no distress, appears stated age  Head:  Normocephalic, without obvious abnormality, atraumatic  Eyes:  , EOM's intact,   Lungs:   No visible respiratory distress  Heart:  Regular rate and rhythm, S1, S2 normal  Abdomen:   Soft, non-tender, nondistended, bowel sounds present, no peritoneal signs  Extremities: Extremities normal, atraumatic, no  edema  Pulses: 2+ and symmetric    Lab Results: Recent Labs    01/05/22 0247 01/06/22 0602  NA 138 139  K 4.6 3.7  CL 103 102  CO2 27 29  GLUCOSE 153* 104*  BUN 8 11  CREATININE 1.21 1.11  CALCIUM 9.4 8.9  MG  --  1.8   Recent Labs    01/04/22 0944  AST 27  ALT 22  ALKPHOS 48  BILITOT 0.7  PROT 7.0  ALBUMIN 4.3   Recent Labs    01/04/22 0944 01/05/22 0247 01/06/22 0602  WBC 7.3 7.3 12.2*  NEUTROABS 6.5  --   --   HGB 15.6 14.8 13.4  HCT 46.3 43.1 38.4*  MCV 86.1 86.4 86.3  PLT 252 233 209   No results for input(s): "LABPROT", "INR" in the last 72 hours.    Assessment/Plan: -Small bowel obstruction likely from underlying active Crohn's disease. -History of recurrent small bowel obstruction. -Chronic Crohn's disease.  On mesalamine.   Recommendations -------------------------- -Patient wants to go home.  Okay to discharge from GI standpoint on prednisone taper.  Prednisone 40 mg once a day for 1 week followed by 30 mg once a day for  another week followed by 20 mg once a day for another week and then 10 mg once a day for 1 week. -Continue mesalamine -Advance diet as tolerated -Discussed with his primary gastroenterologist Dr. Watt Climes about need for biological agent if recurrent/frequent small bowel obstruction. -GI will sign off.  Call us back if needed.  Discussed with hospitalist and RN.   Otis Brace MD, [place] 01/06/2022, 8:37 AM  Contact #  [phone]

## 2022-01-06 NOTE — Progress Notes (Signed)
Patient ID: Shawn Dougherty, male   DOB: 1970/10/29, 51 y.o.   MRN: [id number]      Subjective: Did well with NGT out. Passing a lot of gas. Small BM. Wants to go home. ROS negative except as listed above. Objective: Vital signs in last 24 hours: Temp:  [98 F (36.7 C)-98.9 F (37.2 C)] 98.9 F (37.2 C) (12/15 0342) Pulse Rate:  [65-67] 65 (12/15 0342) Resp:  [16-17] 17 (12/15 0342) BP: (121-126)/(77-84) 121/79 (12/15 0342) SpO2:  [93 %-98 %] 98 % (12/15 0342) Last BM Date : 01/04/22  Intake/Output from previous day: 12/14 0701 - 12/15 0700 In: 1480 [P.O.:480; I.V.:1000] Out: 1550 [Urine:1500; Emesis/NG output:50] Intake/Output this shift: No intake/output data recorded.  General appearance: alert and cooperative Resp: clear to auscultation bilaterally Cardio: regular rate and rhythm GI: soft, nontender, no significant distention  Lab Results: CBC  Recent Labs    01/05/22 0247 01/06/22 0602  WBC 7.3 12.2*  HGB 14.8 13.4  HCT 43.1 38.4*  PLT 233 209   BMET Recent Labs    01/05/22 0247 01/06/22 0602  NA 138 139  K 4.6 3.7  CL 103 102  CO2 27 29  GLUCOSE 153* 104*  BUN 8 11  CREATININE 1.21 1.11  CALCIUM 9.4 8.9   PT/INR No results for input(s): "LABPROT", "INR" in the last 72 hours. ABG No results for input(s): "PHART", "HCO3" in the last 72 hours.  Invalid input(s): "PCO2", "PO2"  Studies/Results: DG Abd 1 View  Result Date: 01/04/2022 CLINICAL DATA:  Enteric catheter placement EXAM: ABDOMEN - 1 VIEW COMPARISON:  01/04/2022 FINDINGS: Supine frontal view of the abdomen and pelvis excludes hemidiaphragms and lower pelvis by collimation. Enteric catheter tip and side port project over the gastric fundus. Distended gas-filled loops of small bowel within the left mid abdomen are stable consistent with obstruction. No masses or abnormal calcifications. IMPRESSION: 1. Enteric catheter tip projecting over the gastric fundus. Electronically Signed   By: Randa Ngo M.D.   On: 01/04/2022 21:53   CT ABDOMEN PELVIS W CONTRAST  Result Date: 01/04/2022 CLINICAL DATA:  Acute abdominal pain since 0100 hours, history of Crohn's disease and small-bowel obstruction, associated nausea without vomiting, states symptoms feel similar to prior bowel obstruction EXAM: CT ABDOMEN AND PELVIS WITH CONTRAST TECHNIQUE: Multidetector CT imaging of the abdomen and pelvis was performed using the standard protocol following bolus administration of intravenous contrast. RADIATION DOSE REDUCTION: This exam was performed according to the departmental dose-optimization program which includes automated exposure control, adjustment of the mA and/or kV according to patient size and/or use of iterative reconstruction technique. CONTRAST:  79m OMNIPAQUE IOHEXOL 350 MG/ML SOLN IV. No oral contrast. COMPARISON:  06/22/2021 FINDINGS: Lower chest: Lung bases clear Hepatobiliary: Gallbladder and liver normal appearance Pancreas: Normal appearance Spleen: Normal appearance Adrenals/Urinary Tract: Adrenal glands, kidneys, ureters, and bladder normal appearance. No urinary tract calcification or dilatation. Stomach/Bowel: Normal appendix. Stomach unremarkable. Colon decompressed. Dilated proximal and decompressed distal small bowel loops consistent with small bowel obstruction. Transition zone in LEFT mid abdomen image 45 with small focus of small bowel wall thickening, may represent stricture or site of active Crohn's disease. Remaining small bowel loops unremarkable. No ileal wall thickening. Vascular/Lymphatic: Vascular structures patent. Aorta normal caliber. No adenopathy. Reproductive: Unremarkable prostate gland and seminal vesicles Other: Small umbilical hernia containing fat. No free air. Small amount of nonspecific free pelvic fluid. Musculoskeletal: Osseous structures unremarkable. IMPRESSION: Small bowel obstruction with transition zone in LEFT mid abdomen with small  focus of small bowel wall  thickening, may represent stricture or site of active Crohn's disease. Small amount of nonspecific free pelvic fluid. Small umbilical hernia containing fat. Electronically Signed   By: Lavonia Dana M.D.   On: 01/04/2022 15:22    Anti-infectives: Anti-infectives (From admission, onward)    None       Assessment/Plan:   Crohn's disease with SBO -agree with medical management by GI, he has improved significantly. No need for surgery at this time. Please call us if things change.   LOS: 2 days    Georganna Skeans, MD, MPH, FACS Trauma & General Surgery Use [email or web address] to contact on call provider  01/06/2022

## 2022-01-06 NOTE — Discharge Summary (Signed)
Physician Discharge Summary  Shawn Dougherty ZWC:[id number] DOB: [birth year] DOA: 01/04/2022  PCP: Burnard Bunting, MD  Admit date: 01/04/2022 Discharge date: 01/06/2022 30 Day Unplanned Readmission Risk Score    Flowsheet Row ED to Hosp-Admission (Current) from 01/04/2022 in [facility]  30 Day Unplanned Readmission Risk Score (%) 14.76 Filed at 01/06/2022 0801       This score is the patient's risk of an unplanned readmission within 30 days of being discharged (0 -100%). The score is based on dignosis, age, lab data, medications, orders, and past utilization.   Low:  0-14.9   Medium: 15-21.9   High: 22-29.9   Extreme: 30 and above          Admitted From: Home Disposition: Home  Recommendations for Outpatient Follow-up:  Follow up with PCP in 1-2 weeks Please obtain BMP/CBC in one week Follow-up with GI in 3 to 4 weeks Please follow up with your PCP on the following pending results: Unresulted Labs (From admission, onward)     Start     Ordered   01/06/22 2353  Basic metabolic panel  Daily,   R      01/05/22 0929              Home Health: None Equipment/Devices: None  Discharge Condition: Stable CODE STATUS: Full code Diet recommendation: Regular  Subjective: Seen and examined.  No complaints.  Eager to go home.  Brief/Interim Summary: 51 y.o. male with medical history significant of Crohn's disease with prior history of SBO who presented with complaints of abdominal pain along with nausea and vomiting.  Was found to have small bowel obstruction.  Hospitalized for further management.  General surgery and GI consulted.  Started on steroids.  Managed conservatively.  SBO resolved.  Passing flatus and had bowel movement and tolerating diet as well.  General surgery cleared him for discharge and sorted GI.  GI recommends 4 weeks of tapering prednisone.  Petra Kuba of Crohn's disease and his recurrent obstruction discussed with the patient.  Option for  starting biological agent as an outpatient also discussed.  Patient is concerned about side effects from biological agent and wants to continue using mesalamine for now.  Advised him to discuss with Dr. Watt Climes as an outpatient for further management of his Crohn's disease.  Discharge plan was discussed with patient and/or family member and they verbalized understanding and agreed with it.  Discharge Diagnoses:  Principal Problem:   SBO (small bowel obstruction) (HCC) Active Problems:   Crohn's disease of small intestine ([place])    Discharge Instructions   Allergies as of 01/06/2022       Reactions   Clindamycin/lincomycin Hives   Penicillins Other (See Comments)   Difficulty breathing, tongue swells        Medication List     TAKE these medications    diphenhydrAMINE 25 MG tablet Commonly known as: BENADRYL Take 25 mg by mouth every 6 (six) hours as needed for allergies.   mesalamine 0.375 g 24 hr capsule Commonly known as: APRISO Take 1.5 g by mouth daily.   predniSONE 10 MG tablet Commonly known as: DELTASONE Take 4 tablets p.o. daily for 7 days, followed by 3 tablets p.o. daily for 7 days, followed by 2 tablets p.o. daily for 7 days followed by 1 tablet p.o. daily for 7 days        Follow-up Information     Burnard Bunting, MD Follow up in 1 week(s).   Specialty: Internal Medicine  Contact information: [address] [place] [address] [phone]                Allergies  Allergen Reactions   Clindamycin/Lincomycin Hives   Penicillins Other (See Comments)    Difficulty breathing, tongue swells    Consultations: GI and general surgery   Procedures/Studies: DG Abd 1 View  Result Date: 01/04/2022 CLINICAL DATA:  Enteric catheter placement EXAM: ABDOMEN - 1 VIEW COMPARISON:  01/04/2022 FINDINGS: Supine frontal view of the abdomen and pelvis excludes hemidiaphragms and lower pelvis by collimation. Enteric catheter tip and side port project  over the gastric fundus. Distended gas-filled loops of small bowel within the left mid abdomen are stable consistent with obstruction. No masses or abnormal calcifications. IMPRESSION: 1. Enteric catheter tip projecting over the gastric fundus. Electronically Signed   By: Randa Ngo M.D.   On: 01/04/2022 21:53   CT ABDOMEN PELVIS W CONTRAST  Result Date: 01/04/2022 CLINICAL DATA:  Acute abdominal pain since 0100 hours, history of Crohn's disease and small-bowel obstruction, associated nausea without vomiting, states symptoms feel similar to prior bowel obstruction EXAM: CT ABDOMEN AND PELVIS WITH CONTRAST TECHNIQUE: Multidetector CT imaging of the abdomen and pelvis was performed using the standard protocol following bolus administration of intravenous contrast. RADIATION DOSE REDUCTION: This exam was performed according to the departmental dose-optimization program which includes automated exposure control, adjustment of the mA and/or kV according to patient size and/or use of iterative reconstruction technique. CONTRAST:  32m OMNIPAQUE IOHEXOL 350 MG/ML SOLN IV. No oral contrast. COMPARISON:  06/22/2021 FINDINGS: Lower chest: Lung bases clear Hepatobiliary: Gallbladder and liver normal appearance Pancreas: Normal appearance Spleen: Normal appearance Adrenals/Urinary Tract: Adrenal glands, kidneys, ureters, and bladder normal appearance. No urinary tract calcification or dilatation. Stomach/Bowel: Normal appendix. Stomach unremarkable. Colon decompressed. Dilated proximal and decompressed distal small bowel loops consistent with small bowel obstruction. Transition zone in LEFT mid abdomen image 45 with small focus of small bowel wall thickening, may represent stricture or site of active Crohn's disease. Remaining small bowel loops unremarkable. No ileal wall thickening. Vascular/Lymphatic: Vascular structures patent. Aorta normal caliber. No adenopathy. Reproductive: Unremarkable prostate gland and  seminal vesicles Other: Small umbilical hernia containing fat. No free air. Small amount of nonspecific free pelvic fluid. Musculoskeletal: Osseous structures unremarkable. IMPRESSION: Small bowel obstruction with transition zone in LEFT mid abdomen with small focus of small bowel wall thickening, may represent stricture or site of active Crohn's disease. Small amount of nonspecific free pelvic fluid. Small umbilical hernia containing fat. Electronically Signed   By: MLavonia DanaM.D.   On: 01/04/2022 15:22     Discharge Exam: Vitals:   01/06/22 0342 01/06/22 0802  BP: 121/79 122/73  Pulse: 65 (!) 58  Resp: 17 18  Temp: 98.9 F (37.2 C) 98.4 F (36.9 C)  SpO2: 98% 97%   Vitals:   01/05/22 1603 01/05/22 1955 01/06/22 0342 01/06/22 0802  BP: 123/77 126/84 121/79 122/73  Pulse: 67 66 65 (!) 58  Resp: 16 17 17 18   Temp: 98.1 F (36.7 C) 98.7 F (37.1 C) 98.9 F (37.2 C) 98.4 F (36.9 C)  TempSrc: Oral  Oral Oral  SpO2: 93% 96% 98% 97%  Weight:      Height:        General: Pt is alert, awake, not in acute distress Cardiovascular: RRR, S1/S2 +, no rubs, no gallops Respiratory: CTA bilaterally, no wheezing, no rhonchi Abdominal: Soft, NT, ND, bowel sounds + Extremities: no edema, no cyanosis  The results of significant diagnostics from this hospitalization (including imaging, microbiology, ancillary and laboratory) are listed below for reference.     Microbiology: No results found for this or any previous visit (from the past 240 hour(s)).   Labs: BNP (last 3 results) No results for input(s): "BNP" in the last 8760 hours. Basic Metabolic Panel: Recent Labs  Lab 01/04/22 0944 01/05/22 0247 01/06/22 0602  NA 137 138 139  K 4.1 4.6 3.7  CL 103 103 102  CO2 24 27 29   GLUCOSE 140* 153* 104*  BUN 9 8 11   CREATININE 1.15 1.21 1.11  CALCIUM 9.5 9.4 8.9  MG  --   --  1.8   Liver Function Tests: Recent Labs  Lab 01/04/22 0944  AST 27  ALT 22  ALKPHOS 48  BILITOT  0.7  PROT 7.0  ALBUMIN 4.3   Recent Labs  Lab 01/04/22 0944  LIPASE 46   No results for input(s): "AMMONIA" in the last 168 hours. CBC: Recent Labs  Lab 01/04/22 0944 01/05/22 0247 01/06/22 0602  WBC 7.3 7.3 12.2*  NEUTROABS 6.5  --   --   HGB 15.6 14.8 13.4  HCT 46.3 43.1 38.4*  MCV 86.1 86.4 86.3  PLT 252 233 209   Cardiac Enzymes: No results for input(s): "CKTOTAL", "CKMB", "CKMBINDEX", "TROPONINI" in the last 168 hours. BNP: Invalid input(s): "POCBNP" CBG: No results for input(s): "GLUCAP" in the last 168 hours. D-Dimer No results for input(s): "DDIMER" in the last 72 hours. Hgb A1c No results for input(s): "HGBA1C" in the last 72 hours. Lipid Profile No results for input(s): "CHOL", "HDL", "LDLCALC", "TRIG", "CHOLHDL", "LDLDIRECT" in the last 72 hours. Thyroid function studies No results for input(s): "TSH", "T4TOTAL", "T3FREE", "THYROIDAB" in the last 72 hours.  Invalid input(s): "FREET3" Anemia work up No results for input(s): "VITAMINB12", "FOLATE", "FERRITIN", "TIBC", "IRON", "RETICCTPCT" in the last 72 hours. Urinalysis    Component Value Date/Time   COLORURINE YELLOW 01/04/2022 1007   APPEARANCEUR CLEAR 01/04/2022 1007   LABSPEC 1.016 01/04/2022 1007   PHURINE 5.0 01/04/2022 1007   GLUCOSEU NEGATIVE 01/04/2022 1007   HGBUR NEGATIVE 01/04/2022 1007   BILIRUBINUR NEGATIVE 01/04/2022 1007   KETONESUR NEGATIVE 01/04/2022 1007   PROTEINUR NEGATIVE 01/04/2022 1007   UROBILINOGEN 0.2 01/24/2014 0451   NITRITE NEGATIVE 01/04/2022 1007   LEUKOCYTESUR NEGATIVE 01/04/2022 1007   Sepsis Labs Recent Labs  Lab 01/04/22 0944 01/05/22 0247 01/06/22 0602  WBC 7.3 7.3 12.2*   Microbiology No results found for this or any previous visit (from the past 240 hour(s)).   Time coordinating discharge: Over 30 minutes  SIGNED:   Darliss Cheney, MD  [facility] 01/06/2022, 10:43 AM *Please note that this is a verbal dictation therefore any spelling or  grammatical errors are due to the "[place] [employer]" system interpretation. If 7PM-7AM, please contact night-coverage [email or web address]

## 2022-01-29 ENCOUNTER — Other Ambulatory Visit: Payer: Self-pay

## 2022-01-29 ENCOUNTER — Emergency Department (HOSPITAL_BASED_OUTPATIENT_CLINIC_OR_DEPARTMENT_OTHER)
Admission: EM | Admit: 2022-01-29 | Discharge: 2022-01-29 | Disposition: A | Discharge status: Home / Self Care | Payer: 59 | Attending: Emergency Medicine | Admitting: Emergency Medicine

## 2022-01-29 ENCOUNTER — Encounter (HOSPITAL_BASED_OUTPATIENT_CLINIC_OR_DEPARTMENT_OTHER): Payer: Self-pay | Admitting: Emergency Medicine

## 2022-01-29 ENCOUNTER — Emergency Department (HOSPITAL_BASED_OUTPATIENT_CLINIC_OR_DEPARTMENT_OTHER): Payer: 59

## 2022-01-29 DIAGNOSIS — I82619 Acute embolism and thrombosis of superficial veins of unspecified upper extremity: Secondary | ICD-10-CM

## 2022-01-29 DIAGNOSIS — R2232 Localized swelling, mass and lump, left upper limb: Secondary | ICD-10-CM | POA: Diagnosis present

## 2022-01-29 DIAGNOSIS — I82612 Acute embolism and thrombosis of superficial veins of left upper extremity: Secondary | ICD-10-CM | POA: Diagnosis not present

## 2022-01-29 NOTE — Discharge Instructions (Signed)
Please continue warm compresses and ibuprofen Additionally please keep extremity elevated and add compression. There is no indication for further treatment at this time as you have a thrombus of the basilic vein which is in the superficial anatomy. However, if that appears to be worsening especially swelling up into the left arm, chest pain, shortness of breath please be reevaluated at that time.

## 2022-01-29 NOTE — ED Provider Notes (Signed)
[facility] EMERGENCY DEPT Provider Note   CSN: [id number] Arrival date & time: 01/29/22  1059     History  Chief Complaint  Patient presents with   Arm Swelling    Shawn Dougherty is a 52 y.o. male.  HPI 52 year old male history of Crohn's, recent IV and December at [facility] presents today with some pain and swelling of his left forearm.  He noticed some redness and then it became somewhat hard and firm yesterday.  He was seen in urgent care and advised that it was superficial thrombophlebitis and to use warm compresses and ibuprofen which she has been doing.  He felt today that there was some tingling in that arm presents secondary to concern for DVT.  He denies any chest pain or shortness of breath.  He is not on any blood thinners.    Home Medications Prior to Admission medications   Medication Sig Start Date End Date Taking? Authorizing Provider  diphenhydrAMINE (BENADRYL) 25 MG tablet Take 25 mg by mouth every 6 (six) hours as needed for allergies.    [provider]  mesalamine (APRISO) 0.375 g 24 hr capsule Take 1.5 g by mouth daily. 01/07/21   [provider]  predniSONE (DELTASONE) 10 MG tablet Take 4 tablets p.o. daily for 7 days, followed by 3 tablets p.o. daily for 7 days, followed by 2 tablets p.o. daily for 7 days followed by 1 tablet p.o. daily for 7 days 01/06/22   Darliss Cheney, MD      Allergies    Clindamycin/lincomycin and Penicillins    Review of Systems   Review of Systems  Physical Exam Updated Vital Signs BP 127/89   Pulse 66   Temp (!) 97.5 F (36.4 C) (Oral)   Resp 14   SpO2 98%  Physical Exam Vitals and nursing note reviewed.  Constitutional:      Appearance: He is well-developed.  HENT:     Head: Normocephalic and atraumatic.     Right Ear: External ear normal.     Left Ear: External ear normal.     Nose: Nose normal.  Eyes:     Extraocular Movements: Extraocular movements intact.  Neck:     Trachea:  No tracheal deviation.  Cardiovascular:     Rate and Rhythm: Normal rate and regular rhythm.  Pulmonary:     Effort: Pulmonary effort is normal.  Musculoskeletal:        General: Normal range of motion.     Comments: Left forearm with firm tender basilic vein.  There is no redness.  There is no proximal tenderness about the elbow.  Skin:    General: Skin is warm and dry.  Neurological:     Mental Status: He is alert and oriented to person, place, and time.  Psychiatric:        Mood and Affect: Mood normal.        Behavior: Behavior normal.     ED Results / Procedures / Treatments   Labs (all labs ordered are listed, but only abnormal results are displayed) Labs Reviewed - No data to display  EKG None  Radiology US Venous Img Upper Left (DVT Study)  Result Date: 01/29/2022 CLINICAL DATA:  Palpable rope like area in the medial aspect of the proximal left forearm. EXAM: LEFT UPPER EXTREMITY VENOUS DOPPLER ULTRASOUND TECHNIQUE: Gray-scale sonography with graded compression, as well as color Doppler and duplex ultrasound were performed to evaluate the upper extremity deep venous system from the level of  the subclavian vein and including the jugular, axillary, basilic, radial, ulnar and upper cephalic vein. Spectral Doppler was utilized to evaluate flow at rest and with distal augmentation maneuvers. COMPARISON:  None Available. FINDINGS: Contralateral Subclavian Vein: Respiratory phasicity is normal and symmetric with the symptomatic side. No evidence of thrombus. Normal compressibility. Internal Jugular Vein: No evidence of thrombus. Normal compressibility, respiratory phasicity and response to augmentation. Subclavian Vein: No evidence of thrombus. Normal compressibility, respiratory phasicity and response to augmentation. Axillary Vein: No evidence of thrombus. Normal compressibility, respiratory phasicity and response to augmentation. Cephalic Vein: No evidence of thrombus. Normal  compressibility, respiratory phasicity and response to augmentation. Basilic Vein: Dislodged vein in the proximal forearm is noncompressible with internal echoes and no demonstrable color signal on Doppler assessment. Imaging features compatible with occlusive thrombus. Basilic vein does appear patent in the upper arm. Brachial Veins: No evidence of thrombus. Normal compressibility, respiratory phasicity and response to augmentation. Radial Veins: No evidence of thrombus. Normal compressibility, respiratory phasicity and response to augmentation. Ulnar Veins: No evidence of thrombus. Normal compressibility, respiratory phasicity and response to augmentation. Venous Reflux:  None visualized. Other Findings:  None visualized. IMPRESSION: Exam is positive for thrombus in the basilic vein of the proximal forearm, compatible with superficial venous thrombosis/thrombophlebitis. Basilic vein in the arm and remaining venous anatomy of the left upper extremity are patent. Electronically Signed   By: Kennith Center M.D.   On: 01/29/2022 13:27    Procedures Procedures    Medications Ordered in ED Medications - No data to display  ED Course/ Medical Decision Making/ A&P                           Medical Decision Making  52 year old male recent IV with numbness in the basilic vein in the proximal left forearm. Care discussed with vascular surgery, on-call.  They advise no anticoagulation.  Will proceed with usual conservative therapy continue warm ice packs and will add compression and elevation.        Final Clinical Impression(s) / ED Diagnoses Final diagnoses:  Basilic vein thrombosis    Rx / DC Orders ED Discharge Orders     None         Margarita Grizzle, MD 01/29/22 1414

## 2022-01-29 NOTE — ED Notes (Signed)
Ambulatory to US.

## 2022-01-29 NOTE — ED Triage Notes (Signed)
Pt had IV around dec 16th while in hospital, noticed 2 days ago the site of his IV (which had been sore) was swollen, saw at urgent care yesterday, felt to be superficial. Today his bicep is swollen and feels somewhat numb. Positive pulses

## 2022-01-29 NOTE — ED Notes (Signed)
EDP at [facility] 

## 2022-10-19 ENCOUNTER — Encounter (HOSPITAL_COMMUNITY): Payer: Self-pay

## 2022-10-19 ENCOUNTER — Emergency Department (HOSPITAL_COMMUNITY): Payer: 59

## 2022-10-19 ENCOUNTER — Other Ambulatory Visit: Payer: Self-pay

## 2022-10-19 ENCOUNTER — Emergency Department (HOSPITAL_COMMUNITY)
Admission: EM | Admit: 2022-10-19 | Discharge: 2022-10-19 | Disposition: A | Discharge status: Home / Self Care | Payer: 59 | Attending: Emergency Medicine | Admitting: Emergency Medicine

## 2022-10-19 DIAGNOSIS — D72829 Elevated white blood cell count, unspecified: Secondary | ICD-10-CM | POA: Insufficient documentation

## 2022-10-19 DIAGNOSIS — R1084 Generalized abdominal pain: Secondary | ICD-10-CM | POA: Diagnosis present

## 2022-10-19 LAB — COMPREHENSIVE METABOLIC PANEL
ALT: 26 U/L (ref 0–44)
AST: 30 U/L (ref 15–41)
Albumin: 4.1 g/dL (ref 3.5–5.0)
Alkaline Phosphatase: 51 U/L (ref 38–126)
Anion gap: 10 (ref 5–15)
BUN: 12 mg/dL (ref 6–20)
CO2: 22 mmol/L (ref 22–32)
Calcium: 9 mg/dL (ref 8.9–10.3)
Chloride: 101 mmol/L (ref 98–111)
Creatinine, Ser: 1.13 mg/dL (ref 0.61–1.24)
GFR, Estimated: >60 mL/min (ref >60)
Glucose, Bld: 157 mg/dL — ABNORMAL HIGH (ref 70–99)
Potassium: 4.1 mmol/L (ref 3.5–5.1)
Sodium: 133 mmol/L — ABNORMAL LOW (ref 135–145)
Total Bilirubin: 1.1 mg/dL (ref 0.3–1.2)
Total Protein: 7 g/dL (ref 6.5–8.1)

## 2022-10-19 LAB — CBC
HCT: 47.6 % (ref 39.0–52.0)
Hemoglobin: 16 g/dL (ref 13.0–17.0)
MCH: 29.7 pg (ref 26.0–34.0)
MCHC: 33.6 g/dL (ref 30.0–36.0)
MCV: 88.3 fL (ref 80.0–100.0)
Platelets: 273 10*3/uL (ref 150–400)
RBC: 5.39 MIL/uL (ref 4.22–5.81)
RDW: 13.6 % (ref 11.5–15.5)
WBC: 13.6 10*3/uL — ABNORMAL HIGH (ref 4.0–10.5)
nRBC: 0 % (ref 0.0–0.2)

## 2022-10-19 LAB — URINALYSIS, ROUTINE W REFLEX MICROSCOPIC
Bilirubin Urine: NEGATIVE
Glucose, UA: NEGATIVE mg/dL
Hgb urine dipstick: NEGATIVE
Ketones, ur: NEGATIVE mg/dL
Leukocytes,Ua: NEGATIVE
Nitrite: NEGATIVE
Protein, ur: NEGATIVE mg/dL
Specific Gravity, Urine: 1.009 (ref 1.005–1.030)
pH: 6 (ref 5.0–8.0)

## 2022-10-19 LAB — LIPASE, BLOOD: Lipase: 38 U/L (ref 11–51)

## 2022-10-19 MED ORDER — SODIUM CHLORIDE 0.9 % IV BOLUS
1000.0000 mL | Freq: Once | INTRAVENOUS | Status: AC
  Administered 2022-10-19: 1000 mL via INTRAVENOUS

## 2022-10-19 MED ORDER — PREDNISONE 10 MG PO TABS: ORAL_TABLET | ORAL | 0 refills | Status: AC

## 2022-10-19 MED ORDER — IOHEXOL 350 MG/ML SOLN
75.0000 mL | Freq: Once | INTRAVENOUS | Status: AC | PRN
  Administered 2022-10-19: 75 mL via INTRAVENOUS

## 2022-10-19 MED ORDER — METHYLPREDNISOLONE SODIUM SUCC 125 MG IJ SOLR
60.0000 mg | Freq: Once | INTRAMUSCULAR | Status: AC
  Administered 2022-10-19: 60 mg via INTRAVENOUS
  Filled 2022-10-19: qty 2

## 2022-10-19 NOTE — ED Provider Notes (Signed)
[place] EMERGENCY DEPARTMENT AT [facility] Provider Note   CSN: [id number] Arrival date & time: 10/19/22  1215     History  Chief Complaint  Patient presents with   Abdominal Pain    Shawn Dougherty is a 52 y.o. male.  Shawn Dougherty is a 53 yo male with past medical history of Crohn's Disease and previous SBO (treated non surgically) who presents to the ED today for diffuse abdominal pain for 2-3 days.  He reports pain has been he denies any fever, nausea/vomiting, and pain with defecation. Patient reports the pain being diffuse throughout abdomen being a 4/10 but gradually getting worse since he has been in the ED. Patient takes Mesalamine for Crohn's, and took 1 tablet of prednisone today when symptoms were worsening. Patient states his last bowel movement was a few hours ago and is able to pass gas. Patient has had past ED visits due to SBO episodes that are related to his Crohn's. Patient past surgical history is pertinent for hernia repair. Followed by Dr. Ewing Schlein with [facility].   The history is provided by the patient and medical records.  Abdominal Pain Associated symptoms: no chest pain, no chills, no cough, no diarrhea, no dysuria, no fever, no nausea, no shortness of breath and no vomiting        Home Medications Prior to Admission medications   Medication Sig Start Date End Date Taking? Authorizing Provider  diphenhydrAMINE (BENADRYL) 25 MG tablet Take 25 mg by mouth every 6 (six) hours as needed for allergies.    [provider]  mesalamine (APRISO) 0.375 g 24 hr capsule Take 1.5 g by mouth daily. 01/07/21   [provider]  predniSONE (DELTASONE) 10 MG tablet Take 4 tablets p.o. daily for 7 days, followed by 3 tablets p.o. daily for 7 days, followed by 2 tablets p.o. daily for 7 days followed by 1 tablet p.o. daily for 7 days 01/06/22   Hughie Closs, MD      Allergies    Clindamycin/lincomycin and Penicillins    Review of Systems    Review of Systems  Constitutional:  Negative for chills and fever.  Respiratory:  Negative for cough and shortness of breath.   Cardiovascular:  Negative for chest pain.  Gastrointestinal:  Positive for abdominal pain. Negative for anal bleeding, blood in stool, diarrhea, nausea and vomiting.  Genitourinary:  Negative for dysuria and frequency.    Physical Exam Updated Vital Signs BP (!) 129/92   Pulse 88   Temp 98.5 F (36.9 C) (Oral)   Resp 17   Ht 5\' 7"  (1.702 m)   Wt 77.1 kg   SpO2 99%   BMI 26.62 kg/m  Physical Exam Vitals and nursing note reviewed.  Constitutional:      General: He is not in acute distress.    Appearance: Normal appearance. He is well-developed. He is not ill-appearing or diaphoretic.  HENT:     Head: Normocephalic and atraumatic.  Eyes:     General:        Right eye: No discharge.        Left eye: No discharge.     Pupils: Pupils are equal, round, and reactive to light.  Cardiovascular:     Rate and Rhythm: Normal rate and regular rhythm.     Pulses: Normal pulses.     Heart sounds: Normal heart sounds.  Pulmonary:     Effort: Pulmonary effort is normal. No respiratory distress.     Breath  sounds: Normal breath sounds. No wheezing or rales.     Comments: Respirations equal and unlabored, patient able to speak in full sentences, lungs clear to auscultation bilaterally  Abdominal:     General: Bowel sounds are normal. There is no distension.     Palpations: Abdomen is soft. There is no mass.     Tenderness: There is abdominal tenderness. There is no guarding.     Comments: Abdomen soft, nondistended, sounds present throughout, there is some mild generalized abdominal tenderness without guarding or peritoneal signs.  Musculoskeletal:        General: No deformity.     Cervical back: Neck supple.  Skin:    General: Skin is warm and dry.     Capillary Refill: Capillary refill takes less than 2 seconds.  Neurological:     Mental Status: He is  alert and oriented to person, place, and time.     Coordination: Coordination normal.     Comments: Speech is clear, able to follow commands Moves extremities without ataxia, coordination intact  Psychiatric:        Mood and Affect: Mood normal.        Behavior: Behavior normal.     ED Results / Procedures / Treatments   Labs (all labs ordered are listed, but only abnormal results are displayed) Labs Reviewed  COMPREHENSIVE METABOLIC PANEL - Abnormal; Notable for the following components:      Result Value   Sodium 133 (*)    Glucose, Bld 157 (*)    All other components within normal limits  CBC - Abnormal; Notable for the following components:   WBC 13.6 (*)    All other components within normal limits  LIPASE, BLOOD  URINALYSIS, ROUTINE W REFLEX MICROSCOPIC    EKG None  Radiology No results found.  Procedures Procedures    Medications Ordered in ED Medications  sodium chloride 0.9 % bolus 1,000 mL (1,000 mLs Intravenous New Bag/Given 10/19/22 1331)  iohexol (OMNIPAQUE) 350 MG/ML injection 75 mL (75 mLs Intravenous Contrast Given 10/19/22 1458)    ED Course/ Medical Decision Making/ A&P Clinical Course as of 10/19/22 1512  Thu Oct 19, 2022  1458 S. Hx crohns w/ prior SBO. Follows w/ GI. Abd pain  x 3 days. Passing gas and having BM. F/u CT scan, talk to GI - [facility] on call today, but belongs to [place] [KM]    Clinical Course User Index [KM] Lyman Speller, MD                                 Medical Decision Making Amount and/or Complexity of Data Reviewed Labs: ordered. Radiology: ordered.  Risk Prescription drug management.   52 year old male presents with generalized abdominal pain for 2 to 3 days gradually worsening, underlying history of Crohn's and small bowel obstructions.  No associated vomiting and he is passing gas and moving his bowels, well-appearing.  Vitals normal on arrival.  Differential includes Crohn's flare, partial or complete small bowel  obstruction, gastroenteritis, colitis, pancreatitis, gastritis, perforation  Labs show mild leukocytosis of 13.6, normal hemoglobin, no significant electrolyte derangements, normal renal and liver function normal lipase, UA without hematuria or signs of infection.  CT abdomen pelvis with contrast ordered to assess for inflammation from Crohn's flare, SBO or other intra-abdominal pathology.  Patient requested to hold off on any pain medication at this time, IV fluid bolus given.  At shift change care signed  out to Dr. Lyman Speller who will follow-up on pending CT scan and consult [facility] if needed to discuss Crohn's flare complications.        Final Clinical Impression(s) / ED Diagnoses Final diagnoses:  Generalized abdominal pain    Rx / DC Orders ED Discharge Orders     None         Legrand Rams 10/19/22 1537    Melene Plan, DO 10/21/22 0725

## 2022-10-19 NOTE — Discharge Instructions (Signed)
Thank you for entrusting [place] with your care today.  You are seen in the emergency department for abdominal pain.  CT scan was suggestive of small bowel obstruction with surrounding inflammatory changes.  He received IV steroids within the emergency department and are being discharged home with a prednisone taper.  Please take this in its entirety.  Please contact your GI physician within the next day to help schedule a follow-up appointment.  Return to the ED at anytime for worsening abdominal pain, nausea, vomiting, fevers, distention, or any other concern.

## 2022-10-19 NOTE — ED Notes (Signed)
IV removed at 19:33

## 2022-10-19 NOTE — ED Triage Notes (Signed)
PT c/o generalized abd pain started last night. Pt denies N/V/D.

## 2022-10-19 NOTE — ED Provider Notes (Signed)
  Physical Exam  BP 131/89   Pulse 81   Temp 98.5 F (36.9 C) (Oral)   Resp 16   Ht 5\' 7"  (1.702 m)   Wt 77.1 kg   SpO2 100%   BMI 26.62 kg/m   Physical Exam Constitutional:      General: He is not in acute distress.    Appearance: He is not ill-appearing.  Abdominal:     General: Abdomen is flat.     Palpations: Abdomen is soft.     Tenderness: There is no abdominal tenderness. There is no guarding or rebound.  Neurological:     General: No focal deficit present.     Mental Status: He is alert.     Procedures  Procedures  ED Course / MDM    Medical Decision Making Amount and/or Complexity of Data Reviewed Labs: ordered. Radiology: ordered. Decision-making details documented in ED Course.  Risk Prescription drug management.   52 year old male with past medical history and HPI as documented above and in previous providers note.  Patient presenting with worsening abdominal pain over the last several days.  Does have a history of Crohn's disease with SBO, but is passing gas and having bowel movements.   Patient's ED workup reviewed.  Mild leukocytosis at 13.6.  Hemoglobin normal. Sodium mildly low at 133 on metabolic panel, but no other gross metabolic derangements.  No AKI.  Lipase is within normal limits.  Urinalysis not consistent with infection.  CT scan of patient's abdomen and pelvis reviewed and shows small bowel wall thickening with proximal small bowel dilatation and air-fluid levels, compatible with small bowel obstruction.  There is also some mesenteric edema with concern for infectious or inflammatory enteritis.  On my reassessment, patient is very well-appearing.  Abdomen is soft, non-peritonitic without any rebound or guarding.  He states that symptoms feel much better than when he first arrived to the emergency department.  Confirms that he is having bowel movements.  No blood in stool.  Given patient's CT findings, I did reach out to the gastroenterology  team on-call.  They reviewed his images in his case and recommended admission to the hospital for IV steroids.  I discussed this recommendation with patient in depth, but he has felt that since he is passing gas and his abdomen is no longer painful that he can return home.  After extensive shared decision making, he was agreeable to IV Solu-Medrol within the emergency department and discharge home with a prescription for prednisone taper.  He says that he has very good follow-up with his GI physician who can work him in within the next 1 to 2 days.  As patient has a benign abdominal exam, no recurrent nausea or vomiting, tolerating p.o., feel that outpatient follow-up is appropriate at this time.  I estimate there is low risk for perforation.  Patient is safe for discharge home at this time.  Extremely strict return precautions provided.        Lyman Speller, MD 10/19/22 4098    Alvira Monday, MD 10/21/22 [id number]

## 2023-08-12 IMAGING — CT CT ABD-PELV W/ CM
2 of 5 series · 16 of 46 positions shown, 18 images · IV contrast (Omni 300)
Comparison: 01/13/2021.

CLINICAL DATA: Severe abdominal pain, bowel obstruction suspected.

EXAM:
CT ABDOMEN AND PELVIS WITH CONTRAST
TECHNIQUE: Multidetector CT imaging of the abdomen and pelvis was performed
using the standard protocol following bolus administration of
intravenous contrast.

[Series 3: a/p w/ 5mm · axial · 0.83mm/px · z∈[+647,+1062]mm · 13 of 95 slices shown, 15 images]
[im 6/95  soft-tissue]
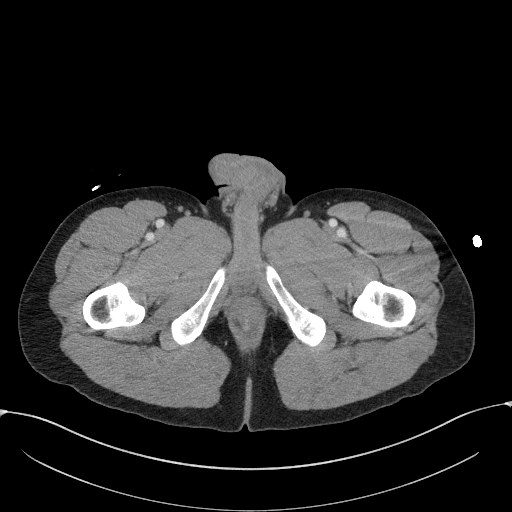
[im 6/95  bone]
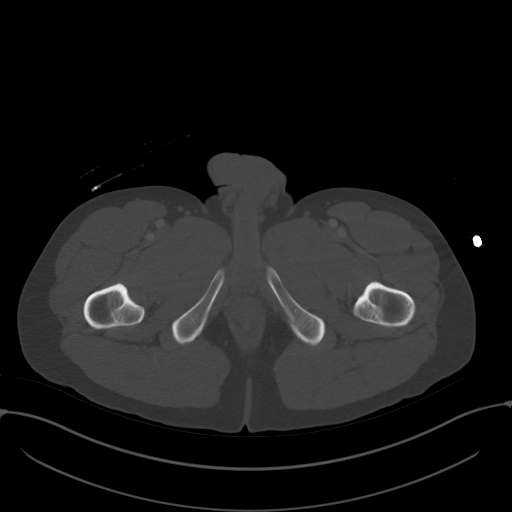
[im 11/95  soft-tissue]
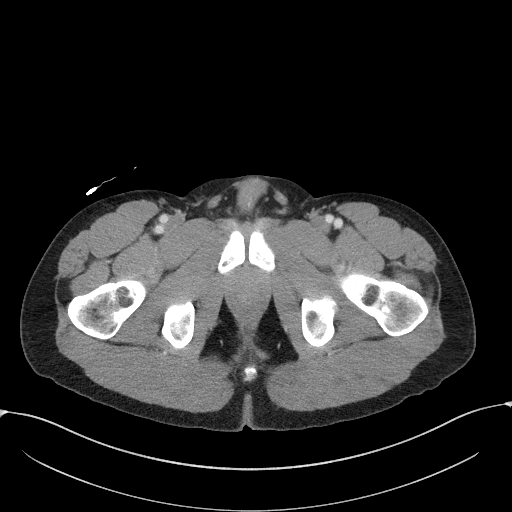
[im 21/95  soft-tissue]
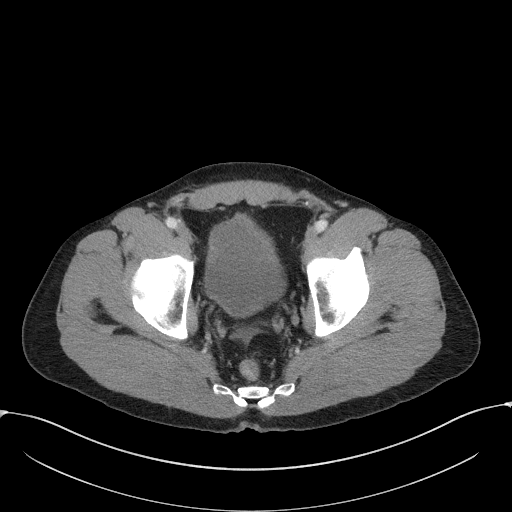
[im 27/95  soft-tissue]
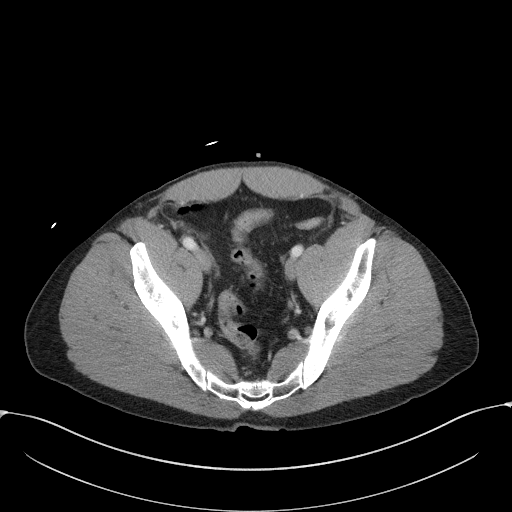
[im 32/95  soft-tissue]
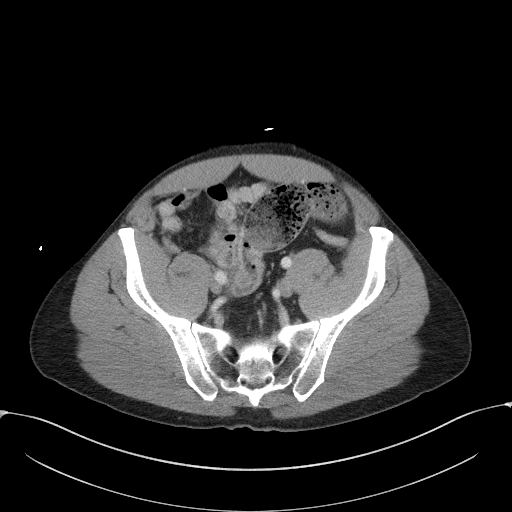
[im 42/95  soft-tissue]
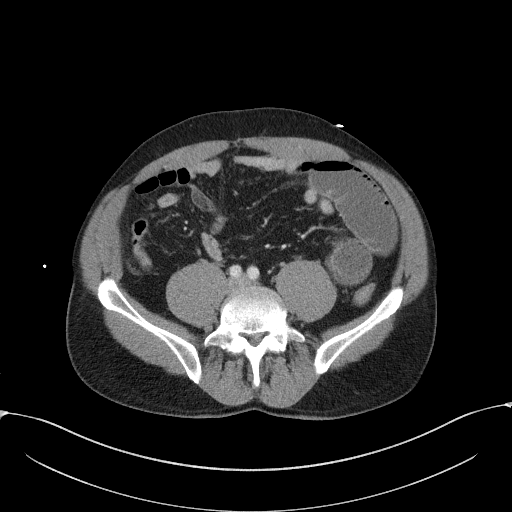
[im 48/95  soft-tissue]
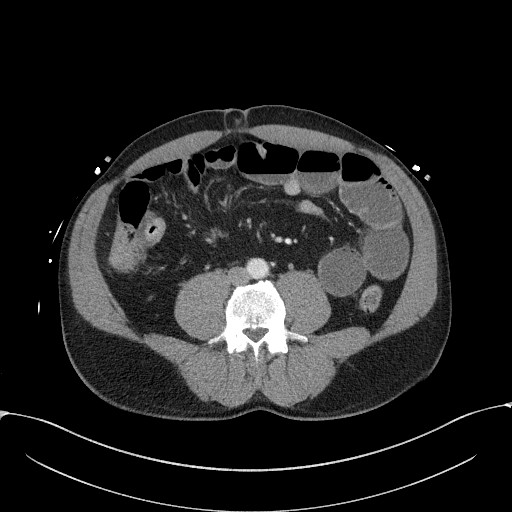
[im 53/95  soft-tissue]
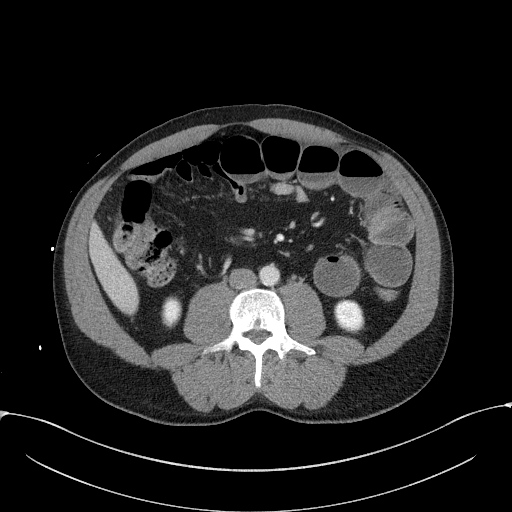
[im 63/95  soft-tissue]
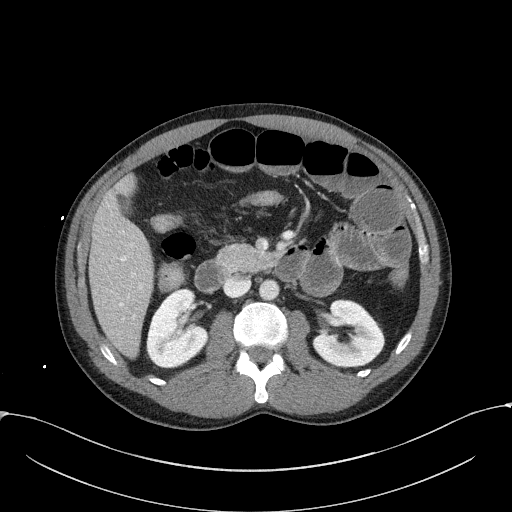
[im 63/95  bone]
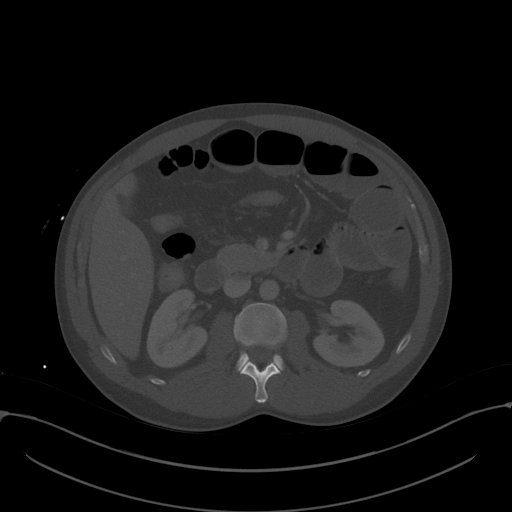
[im 68/95  soft-tissue]
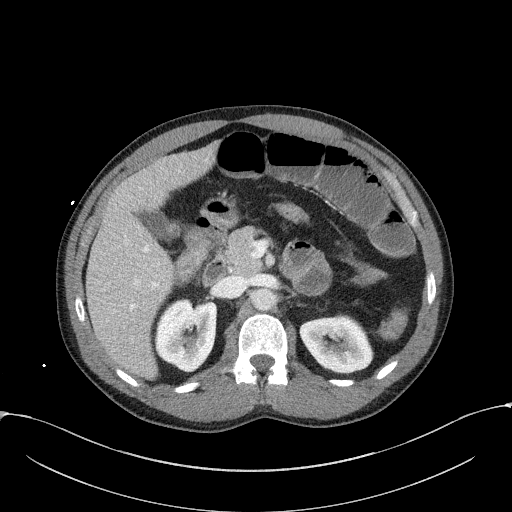
[im 74/95  soft-tissue]
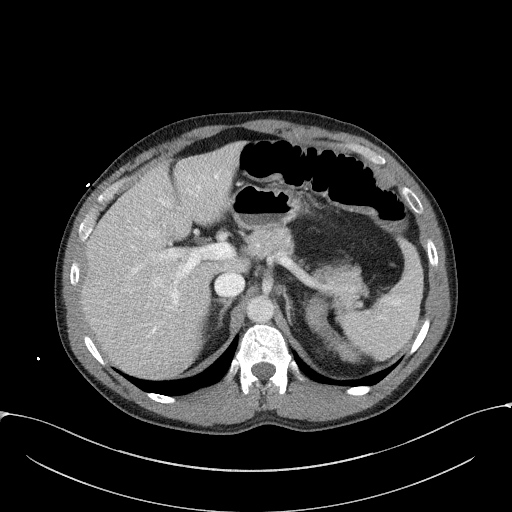
[im 84/95  soft-tissue]
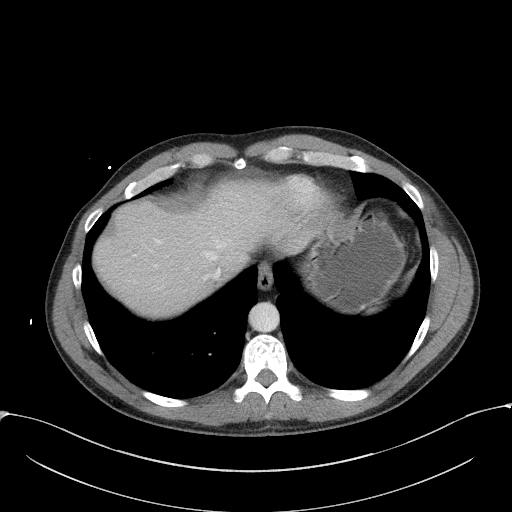
[im 89/95  soft-tissue]
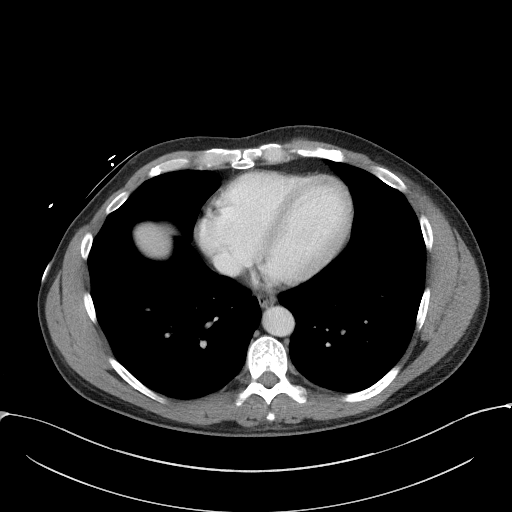

[Series 6: a/p w/ cor · coronal · 0.93mm/px · 3 of 150 slices shown]
[im 50/150  soft-tissue]
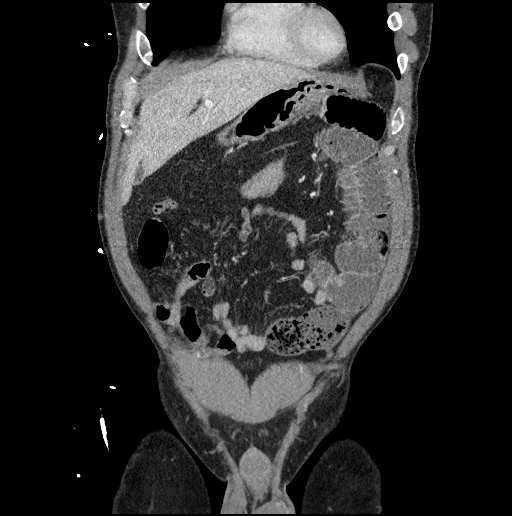
[im 67/150  soft-tissue]
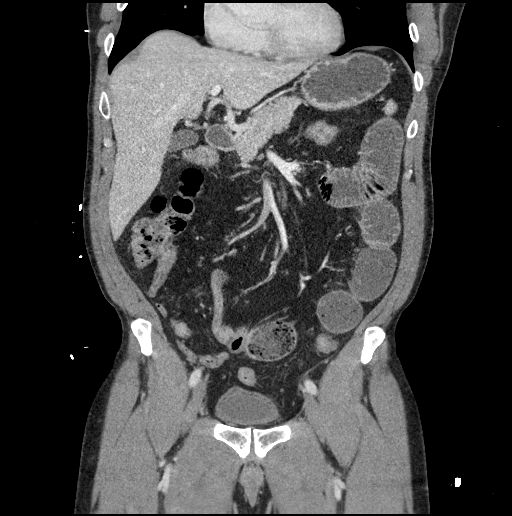
[im 83/150  soft-tissue]
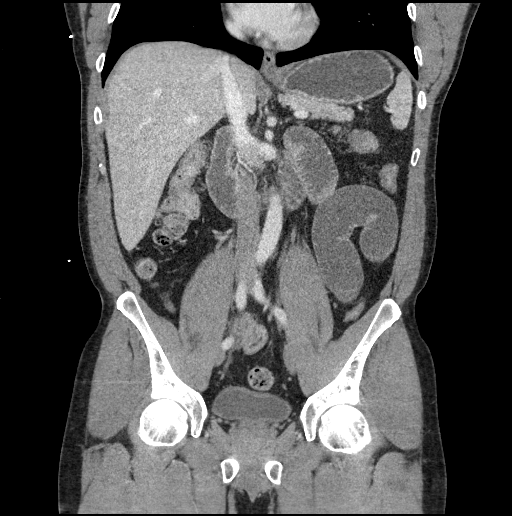

[16 of 46 positions shown; findings below may reference images not displayed]

RADIATION DOSE REDUCTION: This exam was performed according to the
departmental dose-optimization program which includes automated
exposure control, adjustment of the mA and/or kV according to
patient size and/or use of iterative reconstruction technique.

CONTRAST:  100mL OMNIPAQUE IOHEXOL 300 MG/ML  SOLN
FINDINGS: Lower chest: No acute abnormality.

Hepatobiliary: No focal liver abnormality is seen. No gallstones,
gallbladder wall thickening, or biliary dilatation.

Pancreas: Unremarkable. No pancreatic ductal dilatation or
surrounding inflammatory changes.

Spleen: Normal in size without focal abnormality.

Adrenals/Urinary Tract: Adrenal glands are unremarkable. Kidneys are
normal, without renal calculi, focal lesion, or hydronephrosis.
Bladder is unremarkable.

Stomach/Bowel: The stomach is within normal limits. There are
multiple dilated loops of small bowel in the abdomen measuring up to
4.3 cm in diameter. A transition point is seen in the mid pelvis,
coronal image 71 and axial image 64. The small bowel distal to this
is is decompressed. No free air or pneumatosis is seen. A normal
appendix is present in the right lower quadrant.

Vascular/Lymphatic: Aortic atherosclerosis. No enlarged abdominal or
pelvic lymph nodes.

Reproductive: Prostate is unremarkable.

Other: A small amount of free fluid is noted in the mesenteric folds
and pelvis. A small fat containing inguinal hernia is present.

Musculoskeletal: No acute or suspicious osseous abnormality.
IMPRESSION: 1. Multiple loops of distended small bowel in the abdomen with a
transition point in the mid pelvis, compatible with small-bowel
obstruction. Surgical consultation is recommended.
2. Trace amount of free fluid in the mesenteric folds and pelvis.
3. Aortic atherosclerosis.

Critical findings were reported to PA Aliskair Nogales at [DATE] a.m.

## 2023-08-14 IMAGING — DX DG ABD PORTABLE 1V
1 series · 2 of 2 positions shown · non-contrast
Comparison: 04/22/2021

CLINICAL DATA: Abdominal pain, small-bowel obstruction

EXAM:
PORTABLE ABDOMEN - 1 VIEW

[Series 1: abdomen · 0.14mm/px · 2 of 2 slices shown]
[im 1/2]
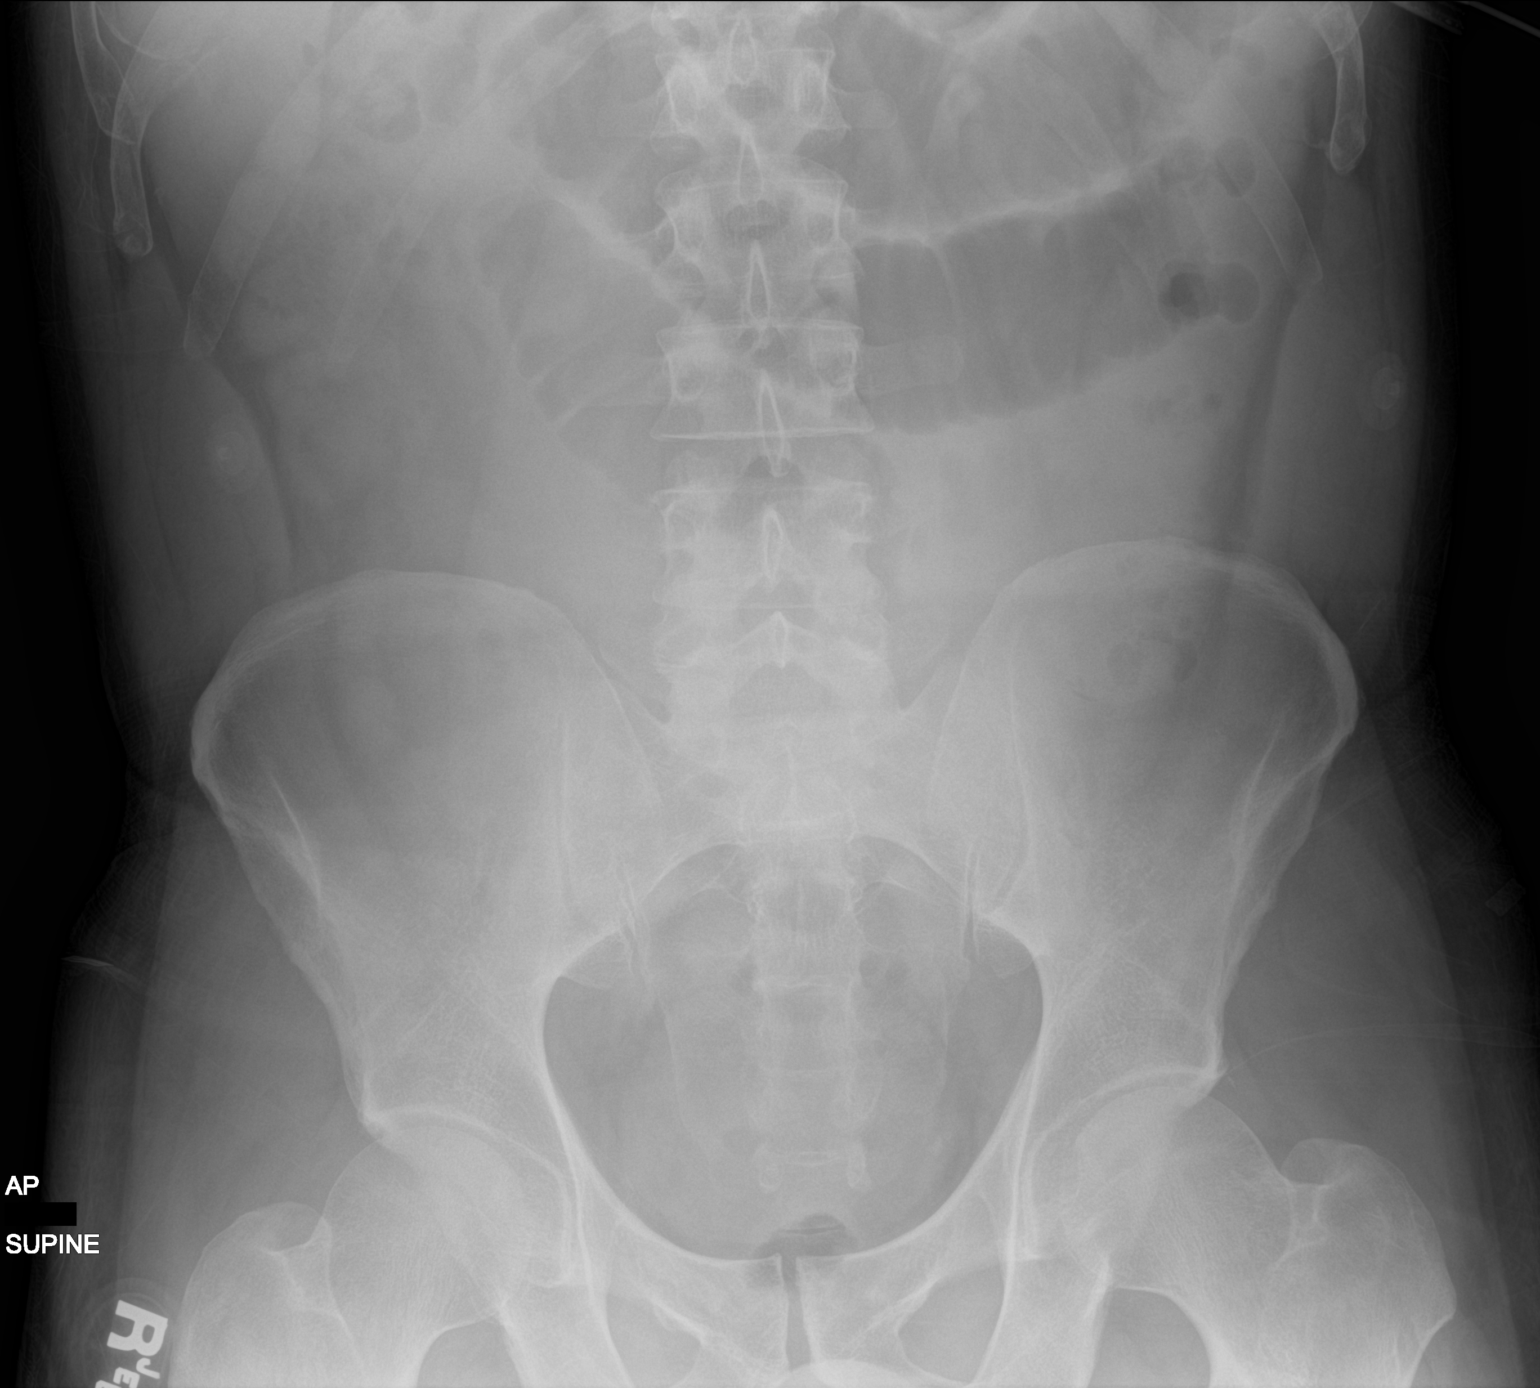
[im 2/2]
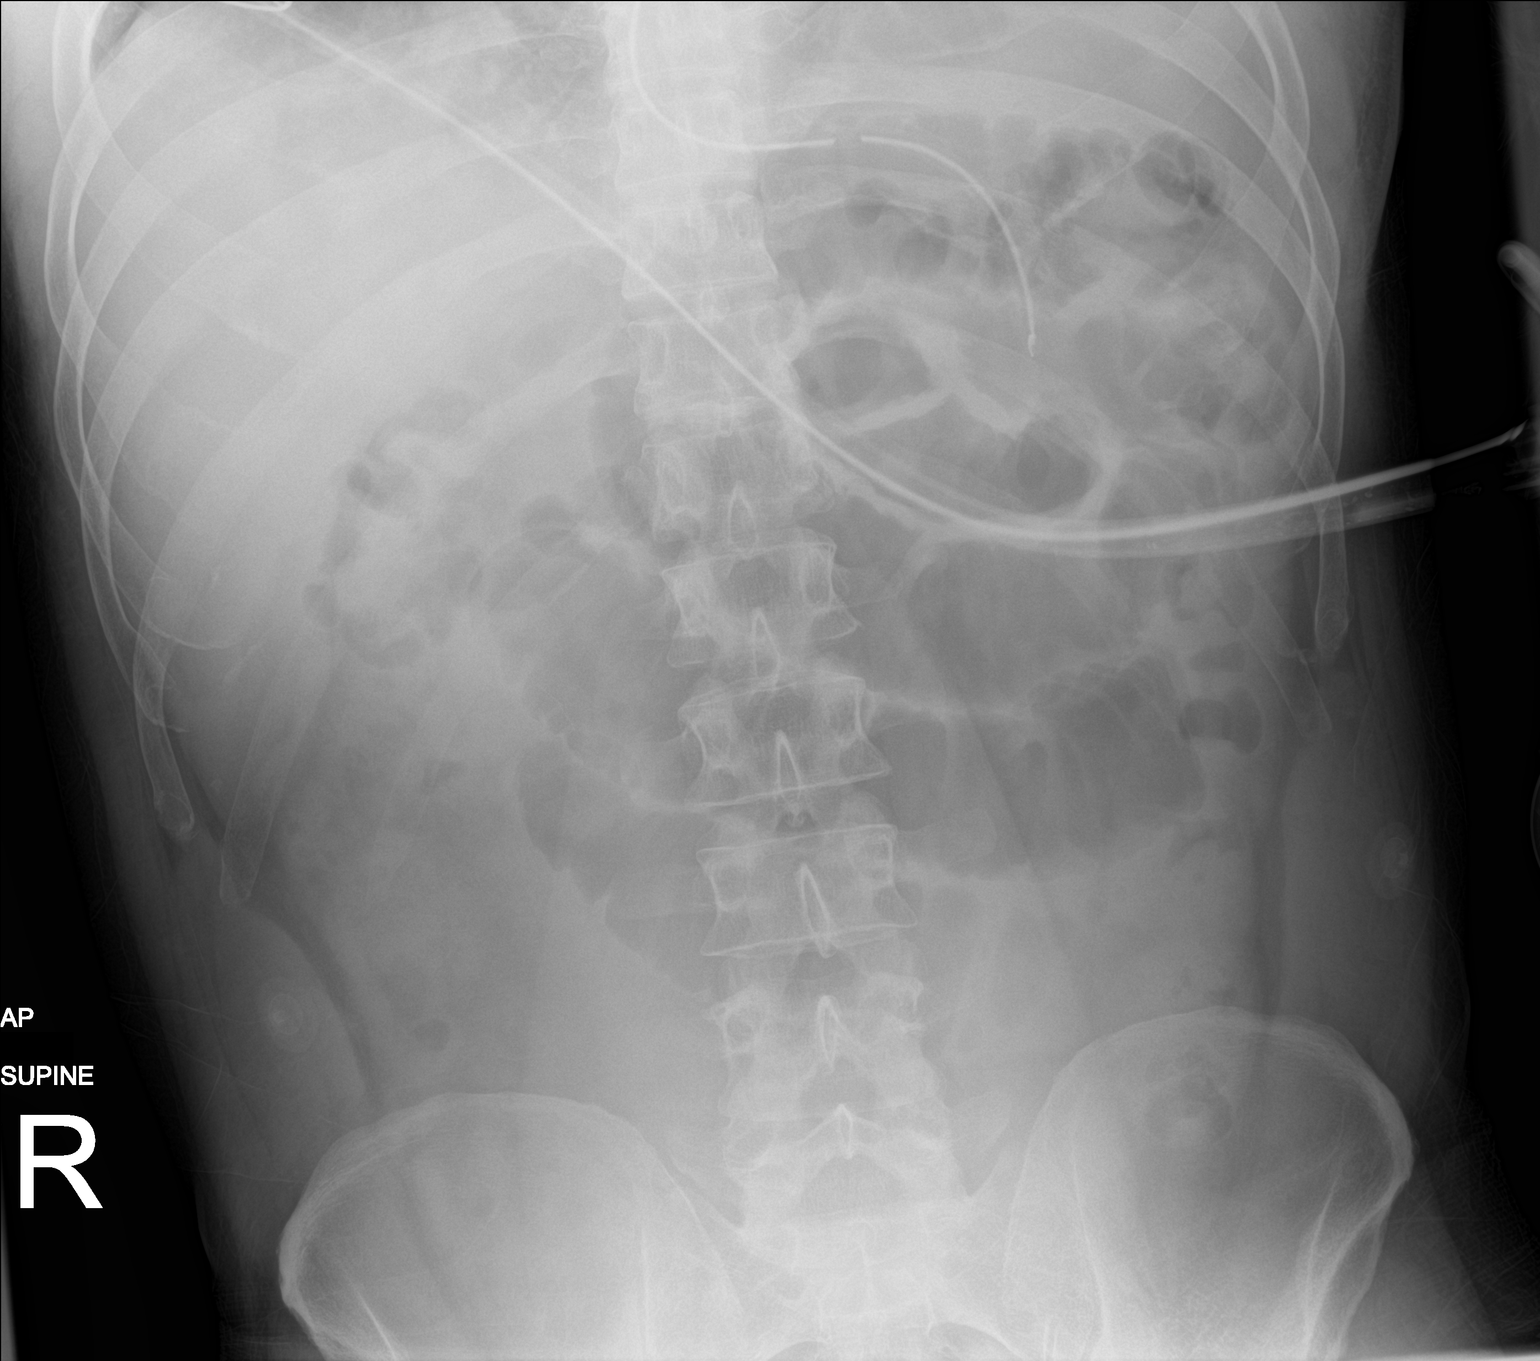

[2 of 2 positions shown; findings below may reference images not displayed]

FINDINGS: NG tube remains within the gastric body. Persistently dilated loops
of small bowel within the abdomen measuring up to 4.2 cm in
diameter, similar to the prior exam. No gross free intraperitoneal
air on AP portable supine view.
IMPRESSION: Small bowel obstruction, similar to the prior exam.

## 2023-08-15 IMAGING — DX DG ABD PORTABLE 1V
2 series · 2 of 2 positions shown · non-contrast
Comparison: 04/24/2021

CLINICAL DATA: Small bowel obstruction

EXAM:
PORTABLE ABDOMEN - 1 VIEW

[abdomen kub (1 of 2)]
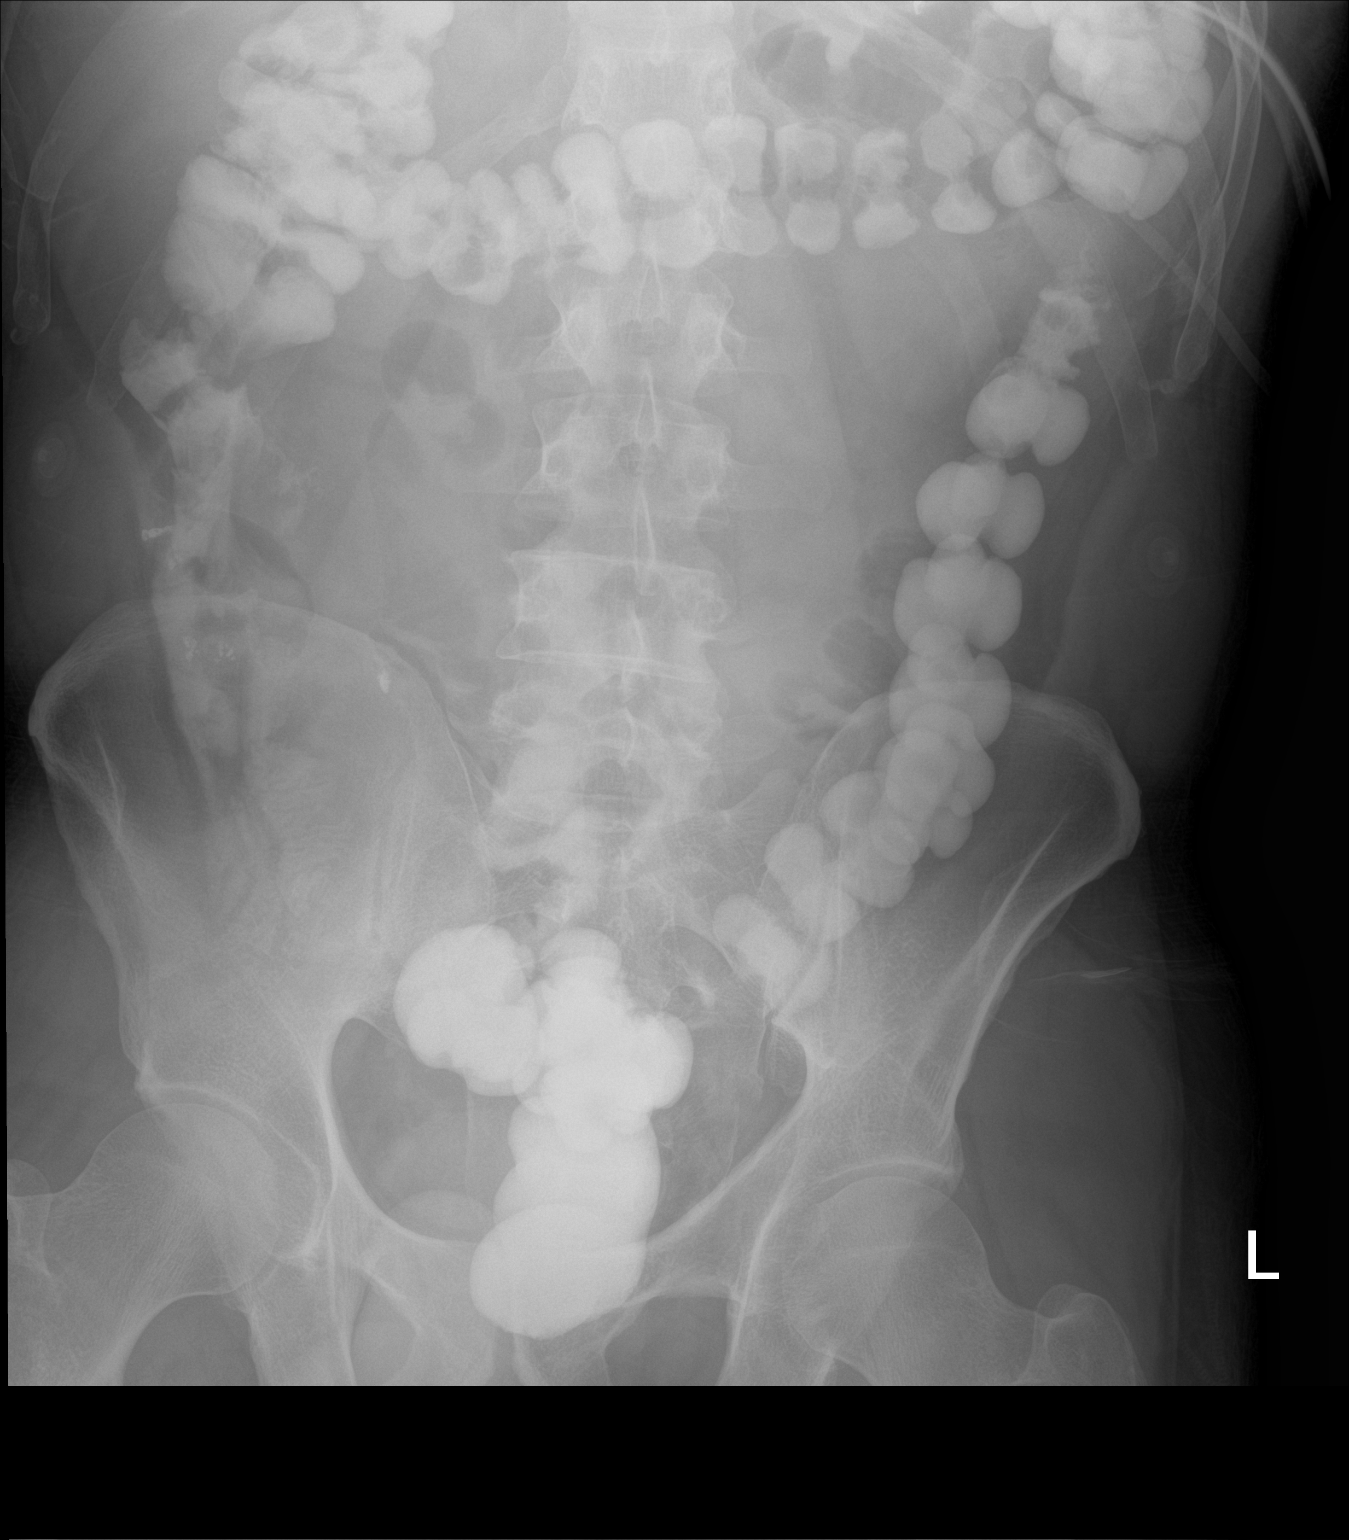

[abdomen kub (2 of 2)]
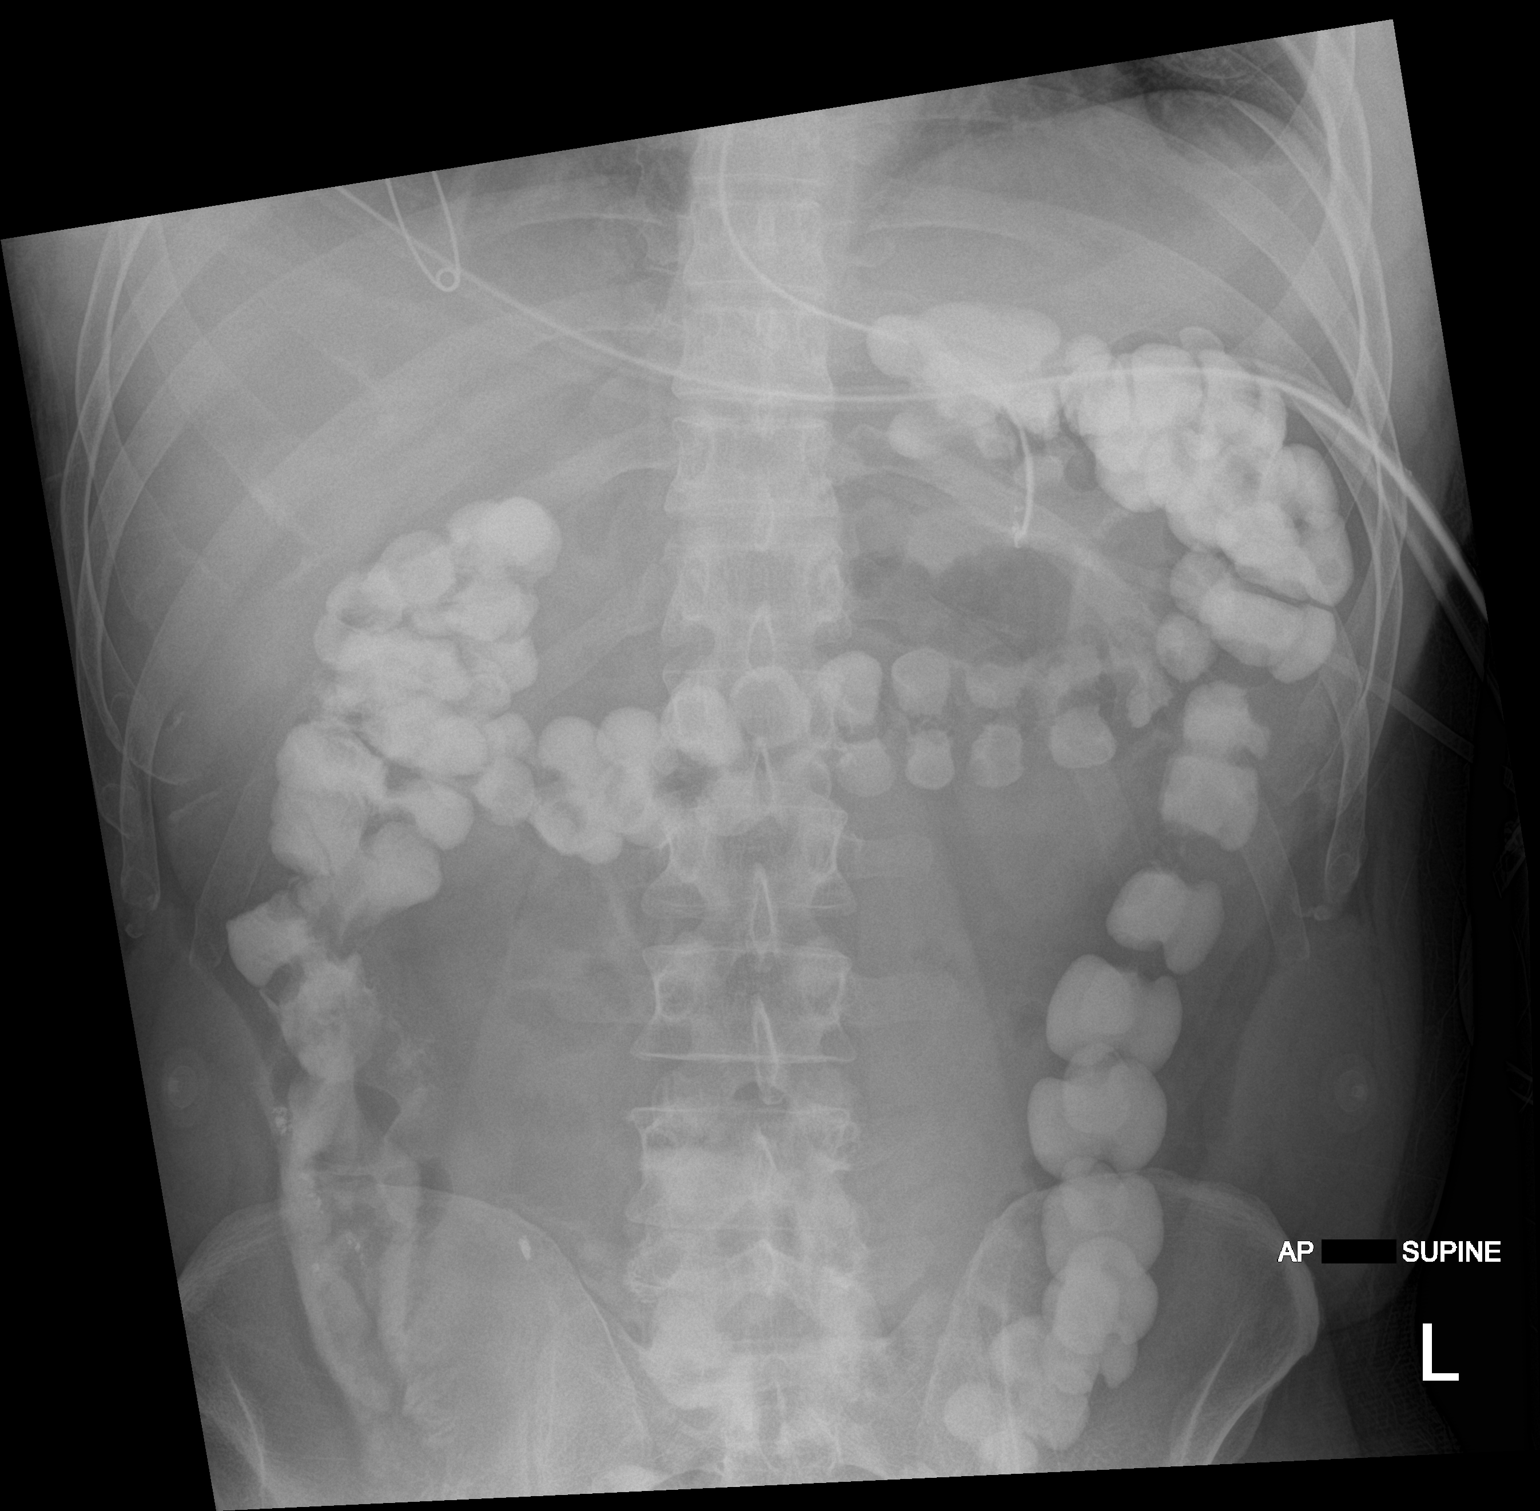

[2 of 2 positions shown; findings below may reference images not displayed]

FINDINGS: NG tube is in the stomach. Decreasing gaseous distention of upper
small bowel loops. Oral contrast material is seen within the colon
which is decompressed. No free air or organomegaly.
IMPRESSION: NG tube in the stomach. Decreasing gaseous distention of small bowel
loops.
